# Patient Record
Sex: Female | Born: 1985
Health system: Southern US, Community
[De-identification: ages and names within clinical notes are randomized; demographics above are authoritative.]

## PROBLEM LIST (undated history)

## (undated) DIAGNOSIS — Z8619 Personal history of other infectious and parasitic diseases: Secondary | ICD-10-CM

## (undated) DIAGNOSIS — Z820 Family history of epilepsy and other diseases of the nervous system: Secondary | ICD-10-CM

## (undated) DIAGNOSIS — Z9189 Other specified personal risk factors, not elsewhere classified: Secondary | ICD-10-CM

## (undated) HISTORY — PX: MOHS SURGERY: SUR867

## (undated) HISTORY — DX: Personal history of other infectious and parasitic diseases: Z86.19

## (undated) HISTORY — PX: WISDOM TOOTH EXTRACTION: SHX21

## (undated) HISTORY — DX: Family history of epilepsy and other diseases of the nervous system: Z82.0

## (undated) HISTORY — DX: Other specified personal risk factors, not elsewhere classified: Z91.89

## (undated) HISTORY — PX: LEEP: SHX91

---

## 2005-03-20 ENCOUNTER — Ambulatory Visit (HOSPITAL_COMMUNITY): Admission: RE | Admit: 2005-03-20 | Discharge: 2005-03-20 | Payer: Self-pay | Admitting: Pediatrics

## 2005-11-11 IMAGING — US US PELVIS COMPLETE
1 series · 14 of 25 positions shown · non-contrast
Comparison: none

CLINICAL DATA: The patient has abdominal pain.
 ULTRASOUND OF THE PELVIS:
 Transabdominal study reveals the uterus to measure 7.0 x 3.0 x 3.4 cm.  The examination is somewhat modified by the fact that the patient could not drink water and the urinary bladder is not optimally dilated.  The uterus and endometrium are unremarkable however with the endometrial stripe measuring 3 mm.  No free fluid is visualized.  The right and left ovary are not visualized.

[Series 1: unknown · 0.21mm/px · 14 of 38 slices shown]
[im 1/38]
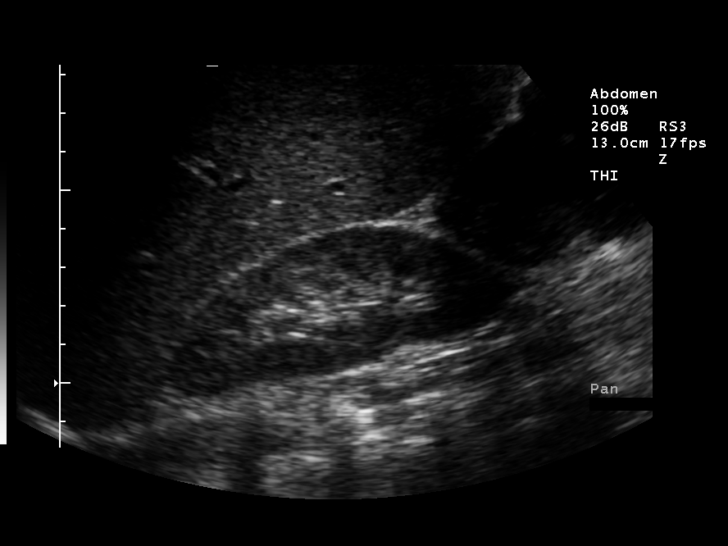
[im 4/38]
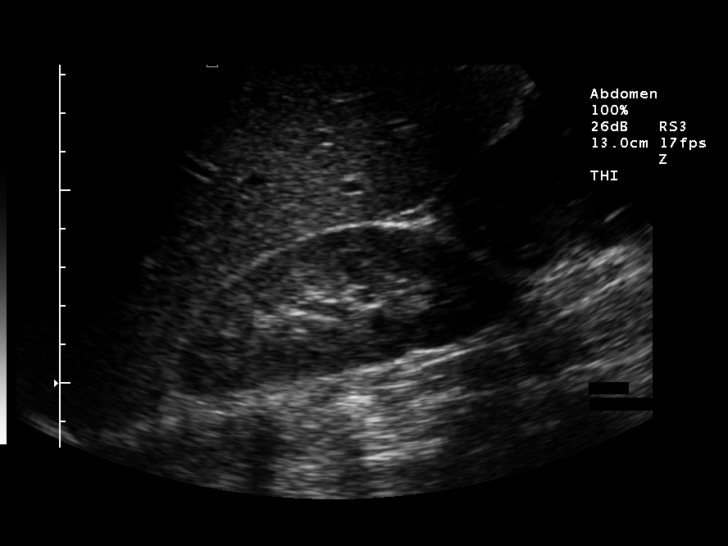
[im 7/38]
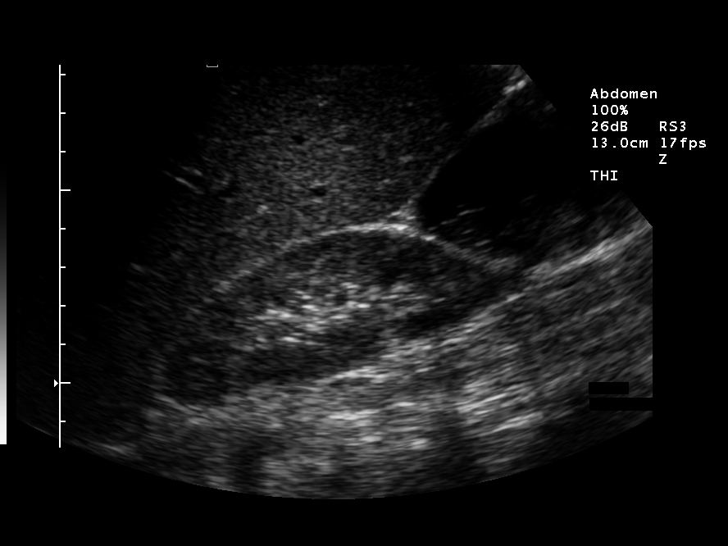
[im 10/38]
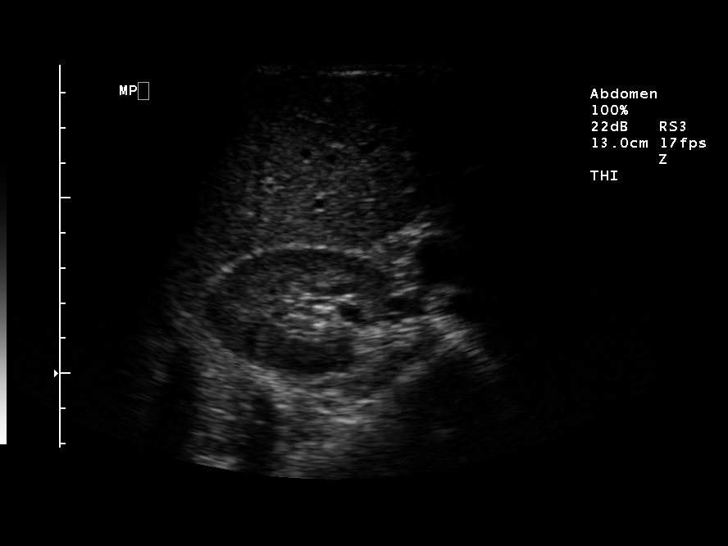
[im 13/38]
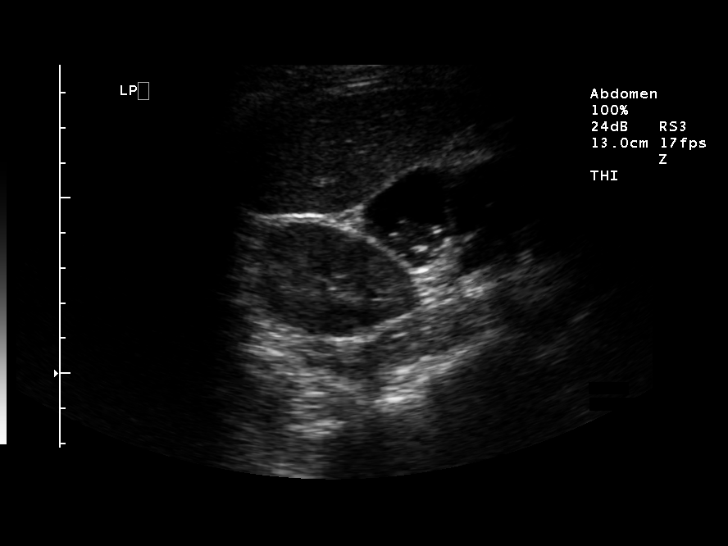
[im 14/38]
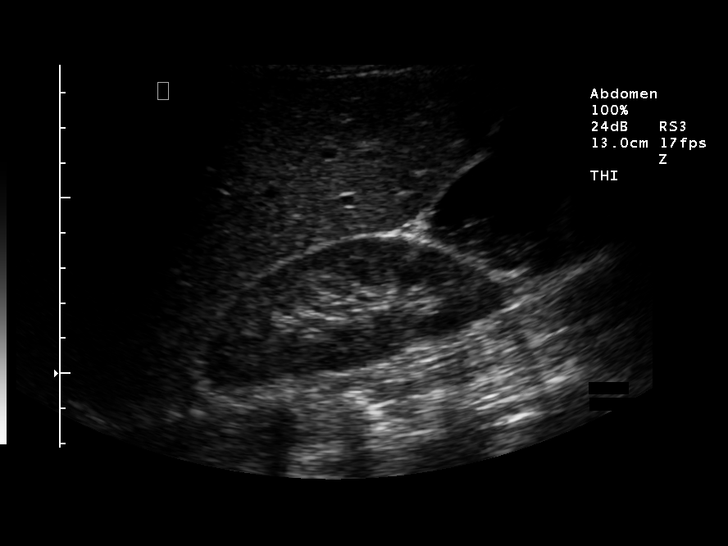
[im 17/38]
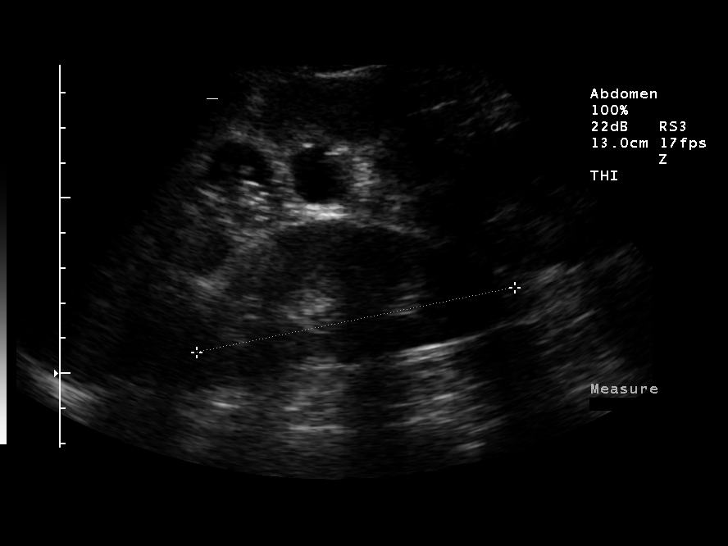
[im 21/38]
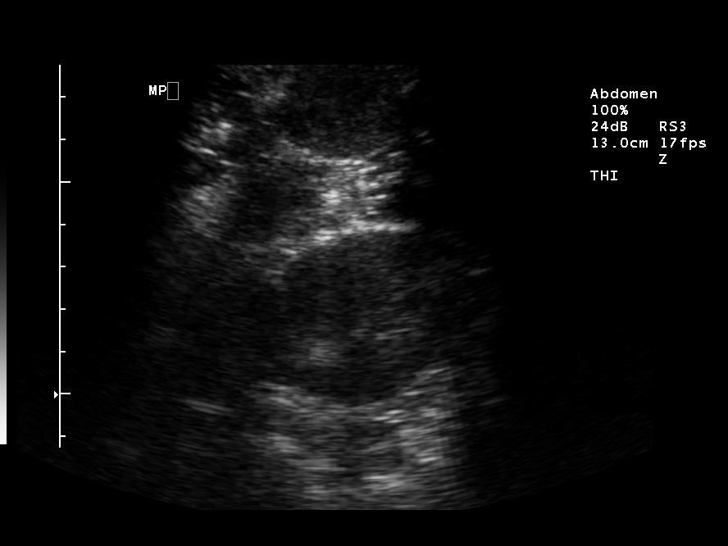
[im 24/38]
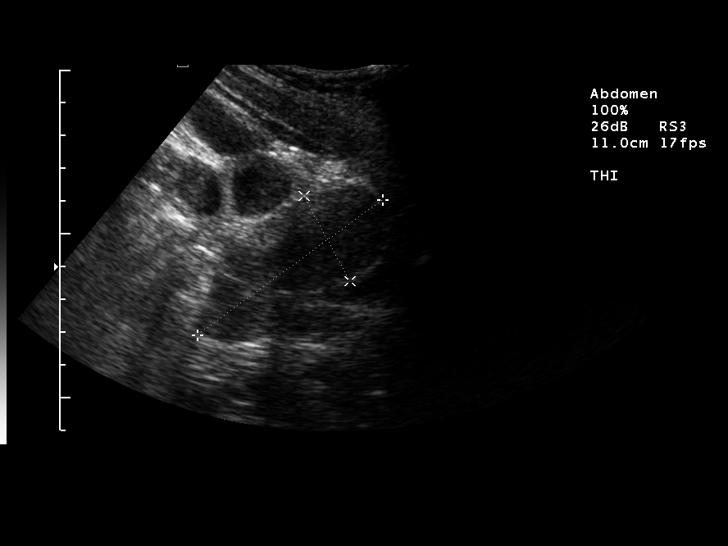
[im 25/38]
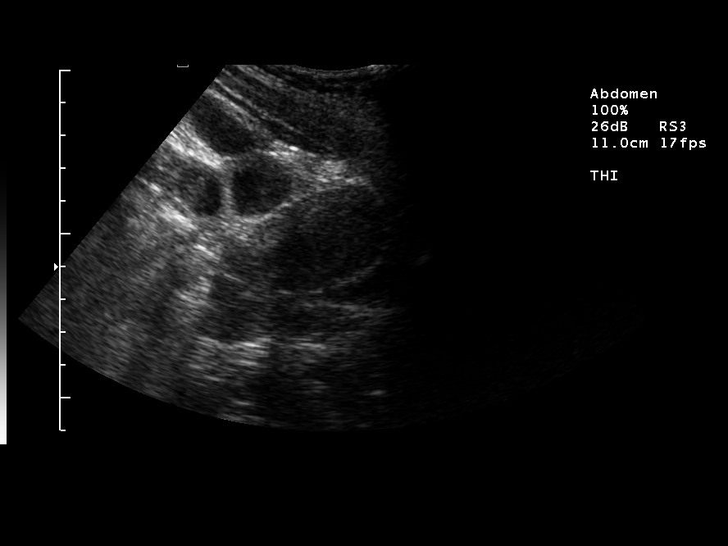
[im 28/38]
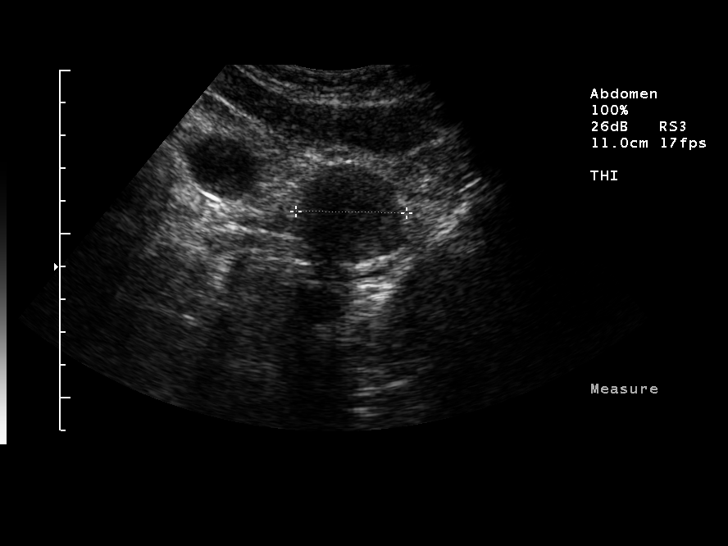
[im 31/38]
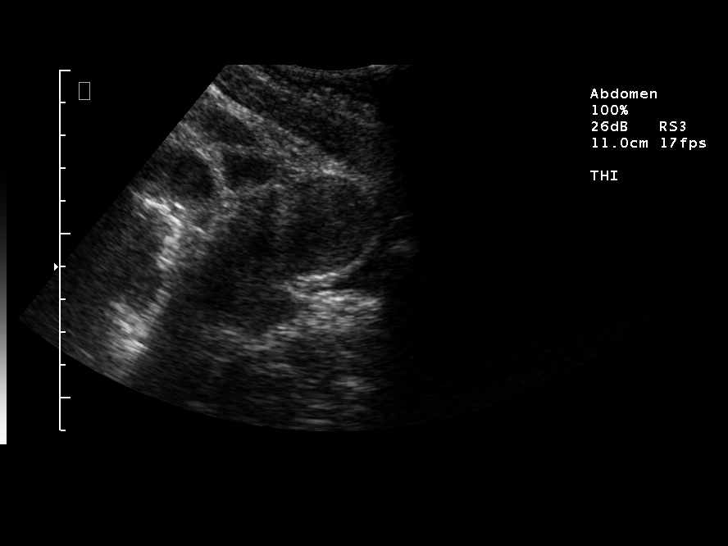
[im 34/38]
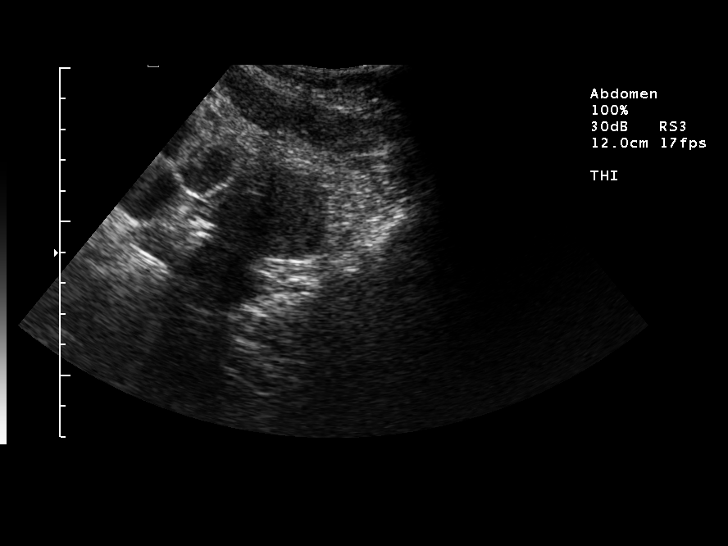
[im 38/38]
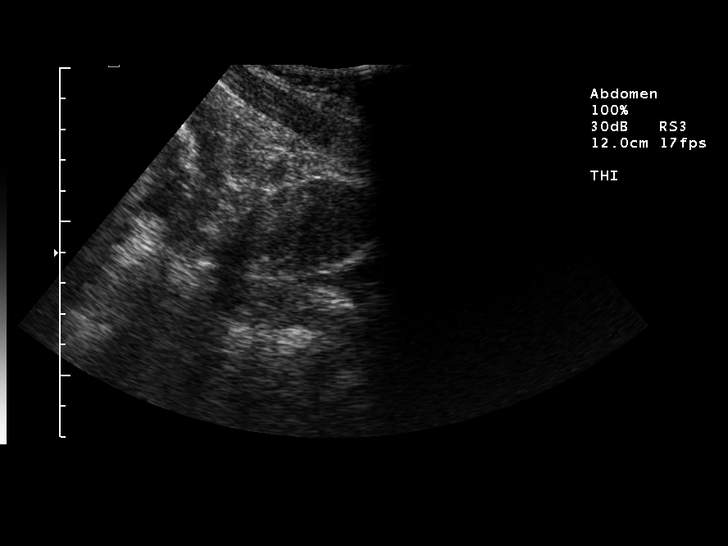

[14 of 25 positions shown; findings below may reference images not displayed]

IMPRESSION: No abnormality.  No evidence of free fluid in the pelvis.  There are noted to be multiple fluid-filled loops of bowel compatible with gastroenteritis.
 RENAL ULTRASOUND:
 The right kidney measures 9.3 cm and the left 9.3 cm.  No hydronephrosis.  The urinary bladder is poorly distended.
IMPRESSION: No abnormality.  No evidence of dilatation of the collecting systems.

## 2005-11-11 IMAGING — CR DG ABDOMEN 1V
1 series · 1 of 1 positions shown · non-contrast
Comparison: none

CLINICAL DATA: Abdominal pain. 
 ABDOMEN ? 1 VIEW:
 AP view of the abdomen reveals the bowel gas pattern to be unremarkable.  There is question of free fluid in the pelvis.

[view not recorded]
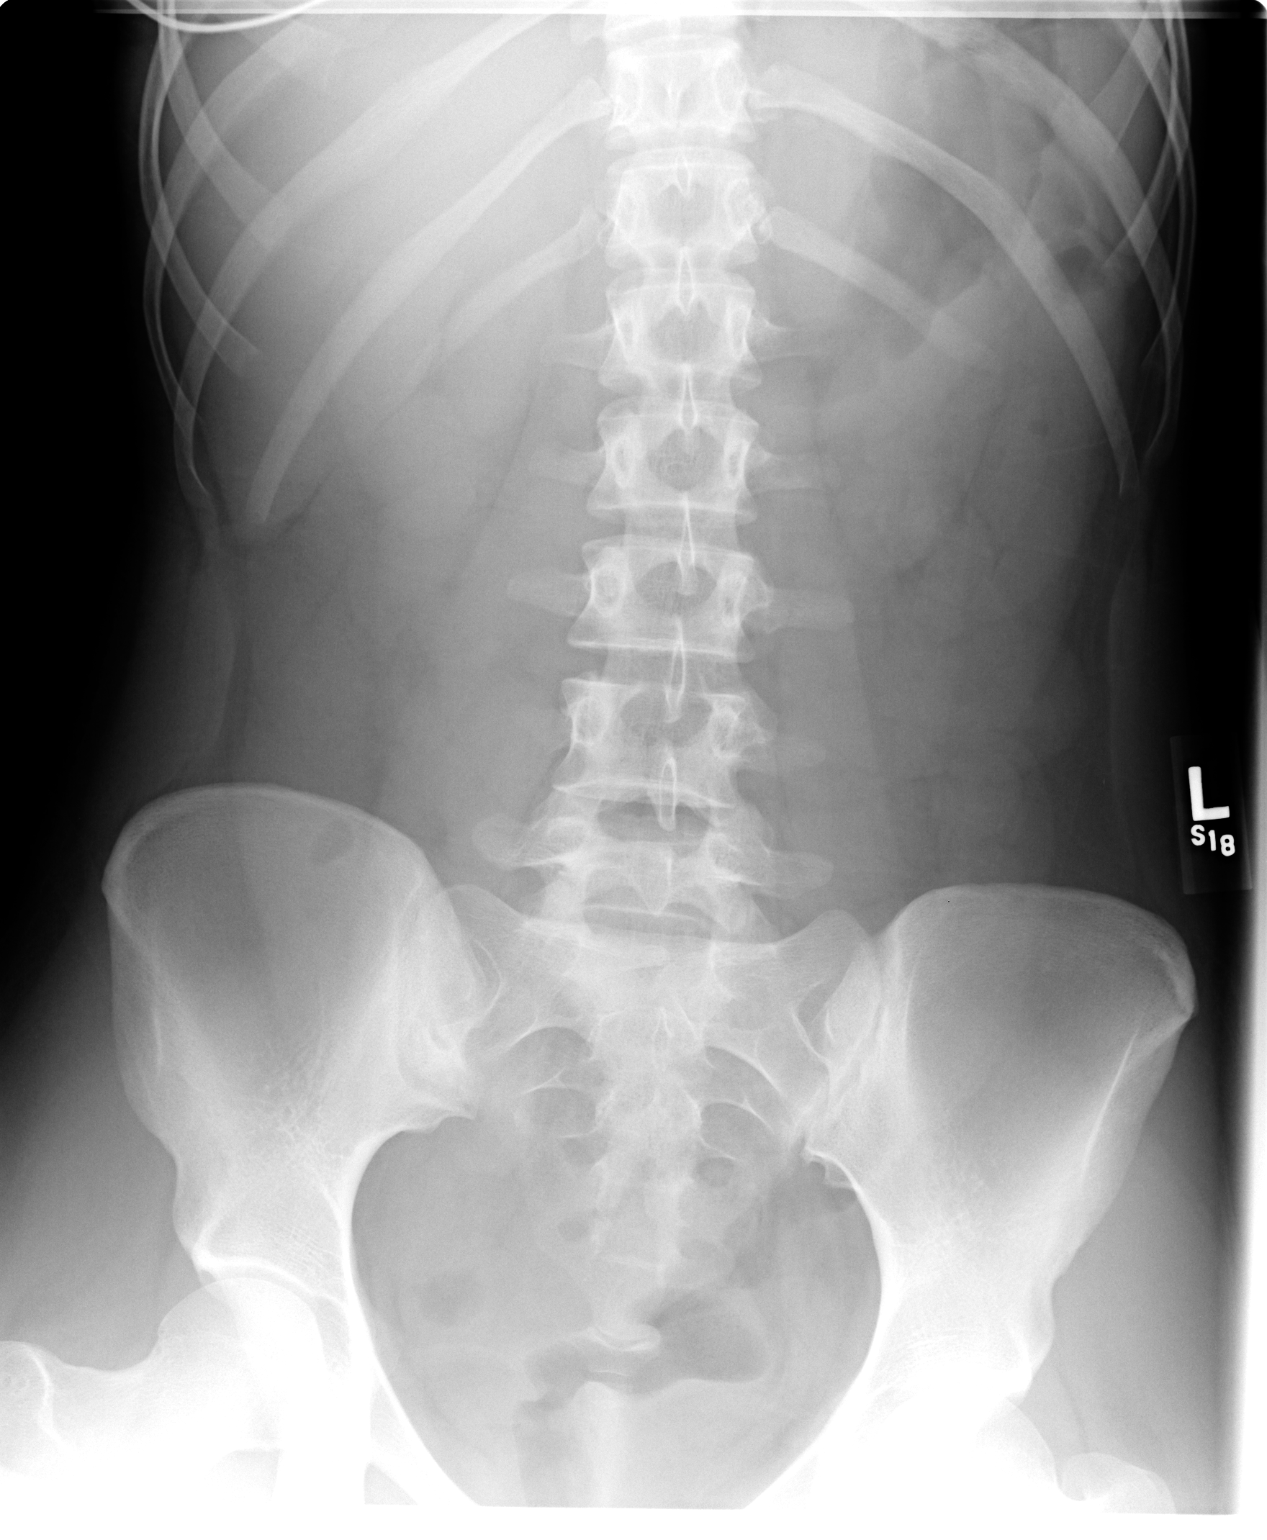

[1 of 1 positions shown; findings below may reference images not displayed]

IMPRESSION: The patient is in extreme abdominal pain.  There is questionable fluid in the pelvis and ultrasound is suggested for further evaluation.

## 2011-09-15 ENCOUNTER — Other Ambulatory Visit (HOSPITAL_COMMUNITY): Payer: Self-pay | Admitting: Orthopaedic Surgery

## 2011-09-15 DIAGNOSIS — M79606 Pain in leg, unspecified: Secondary | ICD-10-CM

## 2011-09-15 DIAGNOSIS — R52 Pain, unspecified: Secondary | ICD-10-CM

## 2011-09-19 ENCOUNTER — Encounter (HOSPITAL_COMMUNITY)
Admission: RE | Admit: 2011-09-19 | Discharge: 2011-09-19 | Disposition: A | Discharge status: Home / Self Care | Payer: 59 | Source: Ambulatory Visit | Attending: Orthopaedic Surgery | Admitting: Orthopaedic Surgery

## 2011-09-19 DIAGNOSIS — R948 Abnormal results of function studies of other organs and systems: Secondary | ICD-10-CM | POA: Insufficient documentation

## 2011-09-19 DIAGNOSIS — M79609 Pain in unspecified limb: Secondary | ICD-10-CM | POA: Insufficient documentation

## 2011-09-19 DIAGNOSIS — R52 Pain, unspecified: Secondary | ICD-10-CM | POA: Insufficient documentation

## 2011-09-19 DIAGNOSIS — M79606 Pain in leg, unspecified: Secondary | ICD-10-CM

## 2011-09-19 MED ORDER — TECHNETIUM TC 99M MEDRONATE IV KIT
25.0000 | PACK | Freq: Once | INTRAVENOUS | Status: AC | PRN
  Administered 2011-09-19: 25 via INTRAVENOUS

## 2012-05-12 IMAGING — NM NM BONE 3 PHASE
1 series · 12 of 12 positions shown · non-contrast
Comparison: None

CLINICAL DATA: Right lower leg pain.  Pain after long distance from
the

NUCLEAR MEDICINE THREE PHASE BONE SCAN
TECHNIQUE: Radionuclide angiographic images, immediate static
blood pool images, and 3-hour delayed static images were obtained
after intravenous injection of radiopharmaceutical.
Radiopharmaceutical:25 mCi technetium 99 MDP

[Series 0: 3 phase bone · 9.33mm/px · 2 acquisitions, 12 frames shown]
[im 1/2]
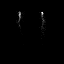
[im 1/2]
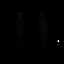
[im 1/2]
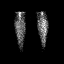
[im 1/2]
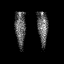
[im 1/2]
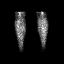
[im 1/2]
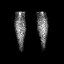
[im 2/2]
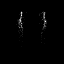
[im 2/2]
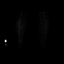
[im 2/2]
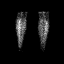
[im 2/2]
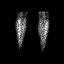
[im 2/2]
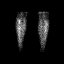
[im 2/2]
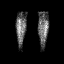

[12 of 12 positions shown; findings below may reference images not displayed]

FINDINGS: Vascular flow:  No abnormal or  asymmetric blood flow to the lower
extremities.

Blood pool: There is a focus of mild abnormal activity within the
distal third of the right tibial diaphysis.

Delayed phase:  There is intense activity in the distal right tibia
which correlates with abnormal blood pool activity.  This is along
the posterior medial aspect of the right tibial metadiaphysis.
IMPRESSION: Findings most consistent with stress fracture of the distal tibial
diaphysis.

## 2012-08-11 ENCOUNTER — Ambulatory Visit (INDEPENDENT_AMBULATORY_CARE_PROVIDER_SITE_OTHER): Payer: 59 | Admitting: Physician Assistant

## 2012-08-11 VITALS — BP 116/70 | HR 80 | Temp 97.7°F | Resp 16 | Ht 64.0 in | Wt 121.0 lb

## 2012-08-11 DIAGNOSIS — J019 Acute sinusitis, unspecified: Secondary | ICD-10-CM

## 2012-08-11 DIAGNOSIS — J329 Chronic sinusitis, unspecified: Secondary | ICD-10-CM

## 2012-08-11 DIAGNOSIS — R059 Cough, unspecified: Secondary | ICD-10-CM

## 2012-08-11 DIAGNOSIS — R05 Cough: Secondary | ICD-10-CM

## 2012-08-11 MED ORDER — HYDROCOD POLST-CHLORPHEN POLST 10-8 MG/5ML PO LQCR: 5.0000 mL | Freq: Two times a day (BID) | ORAL | Status: DC | PRN

## 2012-08-11 MED ORDER — GUAIFENESIN ER 1200 MG PO TB12: 1.0000 | ORAL_TABLET | Freq: Two times a day (BID) | ORAL | Status: DC | PRN

## 2012-08-11 MED ORDER — CEFDINIR 300 MG PO CAPS: 600.0000 mg | ORAL_CAPSULE | Freq: Every day | ORAL | Status: DC

## 2012-08-11 MED ORDER — IPRATROPIUM BROMIDE 0.03 % NA SOLN: 2.0000 | Freq: Two times a day (BID) | NASAL | Status: DC

## 2012-08-11 NOTE — Progress Notes (Signed)
Subjective:    Patient ID: Lindsay Case, female    DOB: 03/23/1986, 26 y.o.   MRN: [id number]  HPI  This 26 y.o. female presents for evaluation of illness x 2 weeks.  She first developed a sore throat 2 weeks ago while flying home from a trip to [place].  After a few days, the sore throat resolved, but she developed nasal congestion, drainage, watery eyes and then cough.  She thought she was improving 08/07/2012, but has worsened again, worse than initially.  Her cough is now productive of green sputum.  She describes facial pressure and pain.  No fever, chills, GI/GU symptoms, myalgias, arthralgias or rash.   Review of Systems As above.  History reviewed. No pertinent past medical history.  Past Surgical History  Procedure Date  . Wisdom tooth extraction     Prior to Admission medications   Medication Sig Start Date End Date Taking? Authorizing Provider  norethindrone-ethinyl estradiol-iron (MICROGESTIN FE,GILDESS FE,LOESTRIN FE) 1.5-30 MG-MCG tablet Take 1 tablet by mouth daily.   Yes Historical Provider, MD    No Known Allergies  History   Social History  . Marital Status: Single    Spouse Name: n/a    Number of Children: 0  . Years of Education: N/A   Occupational History  . RN     General Surgery; [employer]   Social History Main Topics  . Smoking status: Never Smoker   . Smokeless tobacco: Never Used  . Alcohol Use: 1.5 oz/week    3 drink(s) per week  . Drug Use: No  . Sexually Active: Yes    Birth Control/ Protection: Pill   Other Topics Concern  . Not on file   Social History Narrative   Lives with parents.  Long distance relationship with Michele Mcalpine ([facility], [employer])    History reviewed. No pertinent family history.     Objective:   Physical Exam  Vitals reviewed. Constitutional: She is oriented to person, place, and time. Vital signs are normal. She appears well-developed and well-nourished. No distress.  HENT:  Head: Normocephalic and  atraumatic.  Right Ear: Hearing, tympanic membrane, external ear and ear canal normal.  Left Ear: Hearing, tympanic membrane, external ear and ear canal normal.  Nose: Mucosal edema and rhinorrhea present.  No foreign bodies. Right sinus exhibits maxillary sinus tenderness and frontal sinus tenderness. Left sinus exhibits maxillary sinus tenderness and frontal sinus tenderness.  Mouth/Throat: Uvula is midline, oropharynx is clear and moist and mucous membranes are normal. No uvula swelling. No oropharyngeal exudate.  Eyes: Conjunctivae normal and EOM are normal. Pupils are equal, round, and reactive to light. Right eye exhibits no discharge. Left eye exhibits no discharge. No scleral icterus.  Neck: Trachea normal, normal range of motion and full passive range of motion without pain. Neck supple. No mass and no thyromegaly present.  Cardiovascular: Normal rate, regular rhythm and normal heart sounds.   Pulmonary/Chest: Effort normal and breath sounds normal.  Lymphadenopathy:       Head (right side): No submandibular, no tonsillar, no preauricular, no posterior auricular and no occipital adenopathy present.       Head (left side): No submandibular, no tonsillar, no preauricular and no occipital adenopathy present.    She has no cervical adenopathy.       Right: No supraclavicular adenopathy present.       Left: No supraclavicular adenopathy present.  Neurological: She is alert and oriented to person, place, and time. She has normal strength. No cranial  nerve deficit or sensory deficit.  Skin: Skin is warm, dry and intact. No rash noted.  Psychiatric: She has a normal mood and affect. Her speech is normal and behavior is normal.      Assessment & Plan:   1. Sinusitis  ipratropium (ATROVENT) 0.03 % nasal spray, Guaifenesin (MUCINEX MAXIMUM STRENGTH) 1200 MG TB12, cefdinir (OMNICEF) 300 MG capsule  2. Cough  chlorpheniramine-HYDROcodone (TUSSIONEX PENNKINETIC ER) 10-8 MG/5ML [facility]   Supportive  care.  RTC if symptoms worsen/persist.

## 2012-08-11 NOTE — Patient Instructions (Signed)
Get plenty of rest and drink at least 64 ounces of water daily. 

## 2012-11-03 DIAGNOSIS — N871 Moderate cervical dysplasia: Secondary | ICD-10-CM | POA: Insufficient documentation

## 2013-11-02 ENCOUNTER — Ambulatory Visit (INDEPENDENT_AMBULATORY_CARE_PROVIDER_SITE_OTHER): Payer: 59 | Admitting: Family Medicine

## 2013-11-02 VITALS — BP 102/60 | HR 105 | Temp 98.8°F | Resp 18 | Ht 64.0 in | Wt 120.0 lb

## 2013-11-02 DIAGNOSIS — R059 Cough, unspecified: Secondary | ICD-10-CM

## 2013-11-02 DIAGNOSIS — J111 Influenza due to unidentified influenza virus with other respiratory manifestations: Secondary | ICD-10-CM

## 2013-11-02 DIAGNOSIS — J101 Influenza due to other identified influenza virus with other respiratory manifestations: Secondary | ICD-10-CM

## 2013-11-02 DIAGNOSIS — R52 Pain, unspecified: Secondary | ICD-10-CM

## 2013-11-02 DIAGNOSIS — R05 Cough: Secondary | ICD-10-CM

## 2013-11-02 LAB — POCT INFLUENZA A/B
Influenza A, POC: POSITIVE
Influenza B, POC: NEGATIVE

## 2013-11-02 MED ORDER — HYDROCODONE-HOMATROPINE 5-1.5 MG/5ML PO SYRP: 5.0000 mL | ORAL_SOLUTION | ORAL | Status: DC | PRN

## 2013-11-02 MED ORDER — OSELTAMIVIR PHOSPHATE 75 MG PO CAPS: 75.0000 mg | ORAL_CAPSULE | Freq: Two times a day (BID) | ORAL | Status: DC

## 2013-11-02 NOTE — Progress Notes (Signed)
Subjective: Patient has had a sore throat starting at Christmas and some upper respiratory infection symptoms. She got abruptly worse today. She has body aches, chills. She took some Tylenol. She was in surgery and got sore achy she just had to leave. She is a scrub nurse. Her head is congested and she is coughing.  Objective: TMs normal. Nose congested. She is ill-appearing. Her chest is clear. Heart regular without murmurs. Throat clear. Neck supple without nodes.  Assessment: Flulike illness  Plan: Flu test  Results for orders placed in visit on 11/02/13  POCT INFLUENZA A/B      Result Value Range   Influenza A, POC Positive     Influenza B, POC Negative     Have a flu for patient. Also gave a prophylactic dose for her boyfriend, Tonnia Bardin. Hycodan cough Tylenol and ibuprofen Returns if worse

## 2013-11-02 NOTE — Patient Instructions (Addendum)
Drink plenty of fluids  Get a lot of rest  Take the hydrocodone cough syrup 1 teaspoon every 4-6 hours as needed for cough  Take the Tamiflu one twice daily  Use Claritin-D or Allegra-D or Zyrtec daily if needed for congestion  Tylenol or ibuprofen for fever and aching  Return if acutely worse  Influenza, Adult Influenza ("the flu") is a viral infection of the respiratory tract. It occurs more often in winter months because people spend more time in close contact with one another. Influenza can make you feel very sick. Influenza easily spreads from person to person (contagious). CAUSES  Influenza is caused by a virus that infects the respiratory tract. You can catch the virus by breathing in droplets from an infected person's cough or sneeze. You can also catch the virus by touching something that was recently contaminated with the virus and then touching your mouth, nose, or eyes. SYMPTOMS  Symptoms typically last 4 to 10 days and may include:  Fever.  Chills.  Headache, body aches, and muscle aches.  Sore throat.  Chest discomfort and cough.  Poor appetite.  Weakness or feeling tired.  Dizziness.  Nausea or vomiting. DIAGNOSIS  Diagnosis of influenza is often made based on your history and a physical exam. A nose or throat swab test can be done to confirm the diagnosis. RISKS AND COMPLICATIONS You may be at risk for a more severe case of influenza if you smoke cigarettes, have diabetes, have chronic heart disease (such as heart failure) or lung disease (such as asthma), or if you have a weakened immune system. Elderly people and pregnant women are also at risk for more serious infections. The most common complication of influenza is a lung infection (pneumonia). Sometimes, this complication can require emergency medical care and may be life-threatening. PREVENTION  An annual influenza vaccination (flu shot) is the best way to avoid getting influenza. An annual flu shot is  now routinely recommended for all adults in the [place] TREATMENT  In mild cases, influenza goes away on its own. Treatment is directed at relieving symptoms. For more severe cases, your caregiver may prescribe antiviral medicines to shorten the sickness. Antibiotic medicines are not effective, because the infection is caused by a virus, not by bacteria. HOME CARE INSTRUCTIONS  Only take over-the-counter or prescription medicines for pain, discomfort, or fever as directed by your caregiver.  Use a cool mist humidifier to make breathing easier.  Get plenty of rest until your temperature returns to normal. This usually takes 3 to 4 days.  Drink enough fluids to keep your urine clear or pale yellow.  Cover your mouth and nose when coughing or sneezing, and wash your hands well to avoid spreading the virus.  Stay home from work or school until your fever has been gone for at least 1 full day. SEEK MEDICAL CARE IF:   You have chest pain or a deep cough that worsens or produces more mucus.  You have nausea, vomiting, or diarrhea. SEEK IMMEDIATE MEDICAL CARE IF:   You have difficulty breathing, shortness of breath, or your skin or nails turn bluish.  You have severe neck pain or stiffness.  You have a severe headache, facial pain, or earache.  You have a worsening or recurring fever.  You have nausea or vomiting that cannot be controlled. MAKE SURE YOU:  Understand these instructions.  Will watch your condition.  Will get help right away if you are not doing well or get worse. Document Released:  10/17/2000 Document Revised: 04/20/2012 Document Reviewed: 01/19/2012 ExitCare Patient Information 2014 [place], [place].

## 2014-05-22 ENCOUNTER — Ambulatory Visit: Payer: 59 | Attending: Orthopaedic Surgery | Admitting: Physical Therapy

## 2014-05-22 DIAGNOSIS — M25579 Pain in unspecified ankle and joints of unspecified foot: Secondary | ICD-10-CM | POA: Insufficient documentation

## 2014-05-22 DIAGNOSIS — IMO0001 Reserved for inherently not codable concepts without codable children: Secondary | ICD-10-CM | POA: Diagnosis not present

## 2014-05-22 DIAGNOSIS — R5381 Other malaise: Secondary | ICD-10-CM | POA: Diagnosis not present

## 2014-05-22 DIAGNOSIS — R609 Edema, unspecified: Secondary | ICD-10-CM | POA: Insufficient documentation

## 2014-06-06 ENCOUNTER — Ambulatory Visit: Payer: 59 | Attending: Orthopaedic Surgery | Admitting: Rehabilitation

## 2014-06-06 DIAGNOSIS — R5381 Other malaise: Secondary | ICD-10-CM | POA: Insufficient documentation

## 2014-06-06 DIAGNOSIS — R609 Edema, unspecified: Secondary | ICD-10-CM | POA: Diagnosis not present

## 2014-06-06 DIAGNOSIS — M25579 Pain in unspecified ankle and joints of unspecified foot: Secondary | ICD-10-CM | POA: Diagnosis not present

## 2014-06-06 DIAGNOSIS — IMO0001 Reserved for inherently not codable concepts without codable children: Secondary | ICD-10-CM | POA: Insufficient documentation

## 2014-06-12 ENCOUNTER — Ambulatory Visit: Payer: 59 | Admitting: Physical Therapy

## 2014-06-12 DIAGNOSIS — IMO0001 Reserved for inherently not codable concepts without codable children: Secondary | ICD-10-CM | POA: Diagnosis not present

## 2014-06-15 ENCOUNTER — Ambulatory Visit: Payer: 59 | Admitting: Rehabilitation

## 2014-06-15 DIAGNOSIS — IMO0001 Reserved for inherently not codable concepts without codable children: Secondary | ICD-10-CM | POA: Diagnosis not present

## 2014-07-05 ENCOUNTER — Other Ambulatory Visit (HOSPITAL_COMMUNITY): Payer: Self-pay | Admitting: Orthopaedic Surgery

## 2014-07-06 ENCOUNTER — Other Ambulatory Visit (HOSPITAL_COMMUNITY): Payer: Self-pay | Admitting: Orthopaedic Surgery

## 2014-07-06 DIAGNOSIS — M25572 Pain in left ankle and joints of left foot: Secondary | ICD-10-CM

## 2014-07-16 ENCOUNTER — Ambulatory Visit (HOSPITAL_COMMUNITY)
Admission: RE | Admit: 2014-07-16 | Discharge: 2014-07-16 | Disposition: A | Discharge status: Home / Self Care | Payer: 59 | Source: Ambulatory Visit | Attending: Orthopaedic Surgery | Admitting: Orthopaedic Surgery

## 2014-07-16 DIAGNOSIS — S8360XA Sprain of the superior tibiofibular joint and ligament, unspecified knee, initial encounter: Secondary | ICD-10-CM | POA: Diagnosis not present

## 2014-07-16 DIAGNOSIS — M25579 Pain in unspecified ankle and joints of unspecified foot: Secondary | ICD-10-CM | POA: Diagnosis present

## 2014-07-16 DIAGNOSIS — IMO0002 Reserved for concepts with insufficient information to code with codable children: Secondary | ICD-10-CM | POA: Insufficient documentation

## 2014-07-16 DIAGNOSIS — Y9366 Activity, soccer: Secondary | ICD-10-CM | POA: Diagnosis not present

## 2014-07-16 DIAGNOSIS — S99919A Unspecified injury of unspecified ankle, initial encounter: Secondary | ICD-10-CM | POA: Diagnosis present

## 2014-07-16 DIAGNOSIS — M25572 Pain in left ankle and joints of left foot: Secondary | ICD-10-CM

## 2014-07-16 DIAGNOSIS — R609 Edema, unspecified: Secondary | ICD-10-CM | POA: Insufficient documentation

## 2014-07-16 DIAGNOSIS — S8990XA Unspecified injury of unspecified lower leg, initial encounter: Secondary | ICD-10-CM | POA: Diagnosis present

## 2015-11-06 MED FILL — LARIN FE 1.5-30 TABLET: 1.5-30 | 84 days supply | Qty: 84 | Fill #2

## 2016-01-31 MED FILL — LARIN FE 1.5-30 TABLET: 1.5-30 | 84 days supply | Qty: 84 | Fill #3

## 2016-03-04 MED FILL — ONDANSETRON HCL 4 MG TABLET: 4 | 3 days supply | Qty: 6 | Fill #0

## 2016-04-23 MED FILL — LARIN FE 1.5-30 TABLET: 1.5-30 | 84 days supply | Qty: 84 | Fill #4

## 2016-07-03 DIAGNOSIS — N879 Dysplasia of cervix uteri, unspecified: Secondary | ICD-10-CM | POA: Diagnosis not present

## 2016-07-03 DIAGNOSIS — Z124 Encounter for screening for malignant neoplasm of cervix: Secondary | ICD-10-CM | POA: Diagnosis not present

## 2016-07-03 DIAGNOSIS — Z0000 Encounter for general adult medical examination without abnormal findings: Secondary | ICD-10-CM | POA: Diagnosis not present

## 2016-07-03 DIAGNOSIS — Z01419 Encounter for gynecological examination (general) (routine) without abnormal findings: Secondary | ICD-10-CM | POA: Diagnosis not present

## 2016-07-03 DIAGNOSIS — Z6823 Body mass index (BMI) 23.0-23.9, adult: Secondary | ICD-10-CM | POA: Diagnosis not present

## 2016-07-21 MED FILL — LARIN FE 1.5-30 TABLET: 1.5-30 | 84 days supply | Qty: 84 | Fill #0

## 2016-08-26 DIAGNOSIS — D485 Neoplasm of uncertain behavior of skin: Secondary | ICD-10-CM | POA: Diagnosis not present

## 2016-08-26 DIAGNOSIS — D225 Melanocytic nevi of trunk: Secondary | ICD-10-CM | POA: Diagnosis not present

## 2016-09-22 DIAGNOSIS — D485 Neoplasm of uncertain behavior of skin: Secondary | ICD-10-CM | POA: Diagnosis not present

## 2016-09-22 DIAGNOSIS — L988 Other specified disorders of the skin and subcutaneous tissue: Secondary | ICD-10-CM | POA: Diagnosis not present

## 2016-09-22 MED FILL — MUPIROCIN 2% OINTMENT: 2 | 15 days supply | Qty: 22 | Fill #0

## 2016-10-09 MED FILL — LARIN FE 1.5-30 TABLET: 1.5-30 | 84 days supply | Qty: 84 | Fill #1

## 2017-01-05 MED FILL — LARIN FE 1.5-30 TABLET: 1.5-30 | 84 days supply | Qty: 84 | Fill #2

## 2017-03-24 MED FILL — LARIN FE 1.5-30 TABLET: 1.5-30 | 84 days supply | Qty: 84 | Fill #3

## 2017-06-19 MED FILL — LARIN FE 1.5-30 TABLET: 1.5-30 | 84 days supply | Qty: 84 | Fill #4

## 2017-07-09 DIAGNOSIS — Z124 Encounter for screening for malignant neoplasm of cervix: Secondary | ICD-10-CM | POA: Diagnosis not present

## 2017-07-09 DIAGNOSIS — Z01419 Encounter for gynecological examination (general) (routine) without abnormal findings: Secondary | ICD-10-CM | POA: Diagnosis not present

## 2017-07-09 LAB — HM PAP SMEAR

## 2017-08-12 ENCOUNTER — Encounter: Payer: Self-pay | Admitting: Family Medicine

## 2017-08-12 ENCOUNTER — Ambulatory Visit (INDEPENDENT_AMBULATORY_CARE_PROVIDER_SITE_OTHER): Payer: 59 | Admitting: Family Medicine

## 2017-08-12 VITALS — BP 101/68 | HR 65 | Temp 98.0°F | Resp 16 | Ht 64.0 in | Wt 131.1 lb

## 2017-08-12 DIAGNOSIS — Z0000 Encounter for general adult medical examination without abnormal findings: Secondary | ICD-10-CM | POA: Insufficient documentation

## 2017-08-12 DIAGNOSIS — R55 Syncope and collapse: Secondary | ICD-10-CM | POA: Insufficient documentation

## 2017-08-12 DIAGNOSIS — M4124 Other idiopathic scoliosis, thoracic region: Secondary | ICD-10-CM | POA: Diagnosis not present

## 2017-08-12 LAB — CBC WITH DIFFERENTIAL/PLATELET
BASOS ABS: 0 10*3/uL (ref 0.0–0.1)
Basophils Relative: 0.7 % (ref 0.0–3.0)
Eosinophils Absolute: 0.1 10*3/uL (ref 0.0–0.7)
Eosinophils Relative: 1.2 % (ref 0.0–5.0)
HEMATOCRIT: 42.5 % (ref 36.0–46.0)
HEMOGLOBIN: 14.1 g/dL (ref 12.0–15.0)
LYMPHS PCT: 38.1 % (ref 12.0–46.0)
Lymphs Abs: 2.5 10*3/uL (ref 0.7–4.0)
MCHC: 33.2 g/dL (ref 30.0–36.0)
MCV: 93.4 fl (ref 78.0–100.0)
MONOS PCT: 9.4 % (ref 3.0–12.0)
Monocytes Absolute: 0.6 10*3/uL (ref 0.1–1.0)
NEUTROS ABS: 3.4 10*3/uL (ref 1.4–7.7)
Neutrophils Relative %: 50.6 % (ref 43.0–77.0)
PLATELETS: 263 10*3/uL (ref 150.0–400.0)
RBC: 4.55 Mil/uL (ref 3.87–5.11)
RDW: 12.6 % (ref 11.5–15.5)
WBC: 6.7 10*3/uL (ref 4.0–10.5)

## 2017-08-12 LAB — HEPATIC FUNCTION PANEL
ALT: 18 U/L (ref 0–35)
AST: 14 U/L (ref 0–37)
Albumin: 4.6 g/dL (ref 3.5–5.2)
Alkaline Phosphatase: 34 U/L — ABNORMAL LOW (ref 39–117)
BILIRUBIN DIRECT: 0.1 mg/dL (ref 0.0–0.3)
BILIRUBIN TOTAL: 0.5 mg/dL (ref 0.2–1.2)
TOTAL PROTEIN: 7 g/dL (ref 6.0–8.3)

## 2017-08-12 LAB — BASIC METABOLIC PANEL
BUN: 13 mg/dL (ref 6–23)
CALCIUM: 9.9 mg/dL (ref 8.4–10.5)
CO2: 29 mEq/L (ref 19–32)
Chloride: 102 mEq/L (ref 96–112)
Creatinine, Ser: 0.89 mg/dL (ref 0.40–1.20)
GFR: 78.2 mL/min (ref >60.00)
GLUCOSE: 77 mg/dL (ref 70–99)
Potassium: 3.8 mEq/L (ref 3.5–5.1)
SODIUM: 138 meq/L (ref 135–145)

## 2017-08-12 LAB — LIPID PANEL
Cholesterol: 172 mg/dL (ref 0–200)
HDL: 65.5 mg/dL (ref >39.00)
LDL CALC: 89 mg/dL (ref 0–99)
NONHDL: 106.26
Total CHOL/HDL Ratio: 3
Triglycerides: 85 mg/dL (ref 0.0–149.0)
VLDL: 17 mg/dL (ref 0.0–40.0)

## 2017-08-12 LAB — TSH: TSH: 1.23 u[IU]/mL (ref 0.35–4.50)

## 2017-08-12 LAB — VITAMIN D 25 HYDROXY (VIT D DEFICIENCY, FRACTURES): VITD: 30.5 ng/mL (ref 30.00–100.00)

## 2017-08-12 NOTE — Progress Notes (Signed)
   Subjective:    Patient ID: Lindsay Case, female    DOB: 1986-03-01, 31 y.o.   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  Mom pointed out that shoulders are 'uneven' and pt has been having back pain that is starting to limit activities.  Pt reports she is also having 'really bad' R hip pain.  UTD on pap smear.     Review of Systems Patient reports no vision/ hearing changes, adenopathy,fever, weight change,  persistant/recurrent hoarseness , swallowing issues, chest pain, palpitations, edema, persistant/recurrent cough, hemoptysis, dyspnea (rest/exertional/paroxysmal nocturnal), gastrointestinal bleeding (melena, rectal bleeding), abdominal pain, significant heartburn, bowel changes, GU symptoms (dysuria, hematuria, incontinence), Gyn symptoms (abnormal  bleeding, pain),  syncope, focal weakness, memory loss, numbness & tingling, skin/hair/nail changes, abnormal bruising or bleeding.  + depression- pt would note worsening of sxs around period.  Decreased energy level and motivation.  GYN just changed OCPs to see if that would improve things hormonal.     Objective:   Physical Exam General Appearance:    Alert, cooperative, no distress, appears stated age  Head:    Normocephalic, without obvious abnormality, atraumatic  Eyes:    PERRL, conjunctiva/corneas clear, EOM's intact, fundi    benign, both eyes  Ears:    Normal TM's and external ear canals, both ears  Nose:   Nares normal, septum midline, mucosa normal, no drainage    or sinus tenderness  Throat:   Lips, mucosa, and tongue normal; teeth and gums normal  Neck:   Supple, symmetrical, trachea midline, no adenopathy;    Thyroid: no enlargement/tenderness/nodules  Back:     Thoracic curve w/ L shoulder elevation  Lungs:     Clear to auscultation bilaterally, respirations unlabored  Chest Wall:    No tenderness or deformity   Heart:    Regular rate and  rhythm, S1 and S2 normal, no murmur, rub   or gallop  Breast Exam:    Deferred to GYN  Abdomen:     Soft, non-tender, bowel sounds active all four quadrants,    no masses, no organomegaly  Genitalia:    Deferred to GYN  Rectal:    Extremities:   Extremities normal, atraumatic, no cyanosis or edema  Pulses:   2+ and symmetric all extremities  Skin:   Skin color, texture, turgor normal, no rashes or lesions  Lymph nodes:   Cervical, supraclavicular, and axillary nodes normal  Neurologic:   CNII-XII intact, normal strength, sensation and reflexes    throughout          Assessment & Plan:

## 2017-08-12 NOTE — Assessment & Plan Note (Signed)
Pt's PE WNL w/ exception of scoliosis and shoulder asymmetry (L shoulder higher).  UTD on GYN.  Check labs.  Anticipatory guidance provided.

## 2017-08-12 NOTE — Assessment & Plan Note (Signed)
New.  Suspect this is all volume related due to low BP and low HR.  Stressed need for fluids.  Check labs to r/o electrolyte disturbance.  Will follow.

## 2017-08-12 NOTE — Patient Instructions (Signed)
Follow up in 1 year or as needed We'll notify you of your lab results and make any changes if needed Continue to work on healthy diet and regular exercise- you look great! We'll call you with your Dr Rolena Infante appt Call with any questions or concerns Welcome!  We're glad to have you!!!

## 2017-08-13 ENCOUNTER — Encounter: Payer: Self-pay | Admitting: General Practice

## 2017-08-25 DIAGNOSIS — M533 Sacrococcygeal disorders, not elsewhere classified: Secondary | ICD-10-CM | POA: Diagnosis not present

## 2017-08-25 DIAGNOSIS — M545 Low back pain: Secondary | ICD-10-CM | POA: Diagnosis not present

## 2017-08-25 DIAGNOSIS — M542 Cervicalgia: Secondary | ICD-10-CM | POA: Diagnosis not present

## 2017-08-25 DIAGNOSIS — M4150 Other secondary scoliosis, site unspecified: Secondary | ICD-10-CM | POA: Diagnosis not present

## 2017-09-09 MED FILL — LARIN FE 1.5-30 TABLET: 1.5-30 | 63 days supply | Qty: 84 | Fill #0

## 2017-09-14 DIAGNOSIS — M545 Low back pain: Secondary | ICD-10-CM | POA: Diagnosis not present

## 2017-09-14 DIAGNOSIS — G8929 Other chronic pain: Secondary | ICD-10-CM | POA: Diagnosis not present

## 2017-09-16 ENCOUNTER — Ambulatory Visit: Payer: Self-pay | Admitting: *Deleted

## 2017-09-16 NOTE — Telephone Encounter (Signed)
Just FYI -    Patient will call back if no improvement and she needs an appt.

## 2017-09-16 NOTE — Telephone Encounter (Signed)
Patient to call back if her cough persist and does not get better with OTC treatment.  Reason for Disposition . Cough  Answer Assessment - Initial Assessment Questions 1. ONSET: "When did the cough begin?"      Friday-11/9 2. SEVERITY: "How bad is the cough today?"      Seems worse during the day- worse when talking 3. RESPIRATORY DISTRESS: "Describe your breathing."      no 4. FEVER: "Do you have a fever?" If so, ask: "What is your temperature, how was it measured, and when did it start?"     no 5. HEMOPTYSIS: "Are you coughing up any blood?" If so ask: "How much?" (flecks, streaks, tablespoons, etc.)     no 6. TREATMENT: "What have you done so far to treat the cough?" (e.g., meds, fluids, humidifier)     Delsym       Tried a dose of Nyquil- no noticeably helpful Not helping 7. CARDIAC HISTORY: "Do you have any history of heart disease?" (e.g., heart attack, congestive heart failure)      no 8. LUNG HISTORY: "Do you have any history of lung disease?"  (e.g., pulmonary embolus, asthma, emphysema)     no 9. PE RISK FACTORS: "Do you have a history of blood clots?" (or: recent major surgery, recent prolonged travel, bedridden )     no 10. OTHER SYMPTOMS: "Do you have any other symptoms? (e.g., runny nose, wheezing, chest pain)       no 11. PREGNANCY: "Is there any chance you are pregnant?" "When was your last menstrual period?"       No- LMP 2 weeks ago 12. TRAVEL: "Have you traveled out of the country in the last month?" (e.g., travel history, exposures)       no  Protocols used: COUGH - ACUTE NON-PRODUCTIVE-A-AH

## 2017-09-18 DIAGNOSIS — M545 Low back pain: Secondary | ICD-10-CM | POA: Diagnosis not present

## 2017-09-18 DIAGNOSIS — G8929 Other chronic pain: Secondary | ICD-10-CM | POA: Diagnosis not present

## 2017-09-21 DIAGNOSIS — G8929 Other chronic pain: Secondary | ICD-10-CM | POA: Diagnosis not present

## 2017-09-21 DIAGNOSIS — M545 Low back pain: Secondary | ICD-10-CM | POA: Diagnosis not present

## 2017-09-28 DIAGNOSIS — G8929 Other chronic pain: Secondary | ICD-10-CM | POA: Diagnosis not present

## 2017-09-28 DIAGNOSIS — M545 Low back pain: Secondary | ICD-10-CM | POA: Diagnosis not present

## 2017-11-17 DIAGNOSIS — H52222 Regular astigmatism, left eye: Secondary | ICD-10-CM | POA: Diagnosis not present

## 2017-12-02 MED FILL — LARIN FE 1.5-30 TABLET: 1.5-30 | 63 days supply | Qty: 84 | Fill #1

## 2018-01-21 ENCOUNTER — Ambulatory Visit: Payer: Self-pay | Admitting: Nurse Practitioner

## 2018-01-21 ENCOUNTER — Encounter: Payer: Self-pay | Admitting: Nurse Practitioner

## 2018-01-21 ENCOUNTER — Telehealth: Payer: 59 | Admitting: Physician Assistant

## 2018-01-21 VITALS — BP 100/80 | HR 92 | Temp 98.5°F | Wt 132.2 lb

## 2018-01-21 DIAGNOSIS — R071 Chest pain on breathing: Secondary | ICD-10-CM

## 2018-01-21 DIAGNOSIS — J40 Bronchitis, not specified as acute or chronic: Secondary | ICD-10-CM

## 2018-01-21 MED ORDER — PREDNISONE 10 MG (21) PO TBPK
ORAL_TABLET | ORAL | 0 refills | Status: AC
Start: 2018-01-21 — End: 2018-01-27

## 2018-01-21 MED ORDER — PSEUDOEPH-BROMPHEN-DM 30-2-10 MG/5ML PO SYRP: 5.0000 mL | ORAL_SOLUTION | Freq: Four times a day (QID) | ORAL | 0 refills | Status: AC | PRN

## 2018-01-21 MED ORDER — ALBUTEROL SULFATE HFA 108 (90 BASE) MCG/ACT IN AERS: 2.0000 | INHALATION_SPRAY | Freq: Four times a day (QID) | RESPIRATORY_TRACT | 0 refills | Status: DC | PRN

## 2018-01-21 MED ORDER — AZITHROMYCIN 250 MG PO TABS: ORAL_TABLET | ORAL | 0 refills | Status: AC

## 2018-01-21 NOTE — Progress Notes (Signed)
Subjective:    Patient ID: Lindsay Case, female    DOB: [birth year], 32 y.o.   MRN: [id number]  Cough  This is a new problem. The current episode started in the past 7 days. The problem has been gradually worsening. The problem occurs every few minutes. The cough is non-productive. Associated symptoms include chest pain, chills, headaches, nasal congestion, postnasal drip and rhinorrhea. Pertinent negatives include no ear pain, fever, sore throat or wheezing. Nothing aggravates the symptoms. Treatments tried: Dayquil and Nyquil. The treatment provided mild relief. Her past medical history is significant for environmental allergies. There is no history of asthma, bronchiectasis, bronchitis or pneumonia.   Patient completed and e-visit earlier today and was instructed to follow up where she can have an x-ray.  Patient informed that there was no x-ray at this location, patient states, "Im here now and this is where they told me to come".    Reviewed patient's past medical history, allergies and medications.  Review of Systems  Constitutional: Positive for chills and fatigue. Negative for fever.  HENT: Positive for postnasal drip and rhinorrhea. Negative for ear pain, sinus pressure, sinus pain, sneezing and sore throat.   Respiratory: Positive for cough. Negative for wheezing.   Cardiovascular: Positive for chest pain.  Skin: Negative.   Allergic/Immunologic: Positive for environmental allergies.  Neurological: Positive for headaches.       Objective:   Physical Exam  Constitutional: She is oriented to person, place, and time. She appears well-developed and well-nourished. She appears distressed.  Due to coughing.  Patient able to speak in complete sentences.  HENT:  Head: Normocephalic and atraumatic.  Right Ear: External ear normal.  Left Ear: External ear normal.  Eyes: Pupils are equal, round, and reactive to light. Conjunctivae and EOM are normal.  Neck: Normal range of motion.  Neck supple. No tracheal deviation present. No thyromegaly present.  Cardiovascular: Normal rate, regular rhythm and normal heart sounds.  Pulmonary/Chest: Effort normal and breath sounds normal. No respiratory distress. She has no wheezes.  Abdominal: Soft. Bowel sounds are normal. She exhibits no distension. There is no tenderness.  Neurological: She is alert and oriented to person, place, and time.  Skin: Skin is warm and dry.  Psychiatric: She has a normal mood and affect. Her behavior is normal. Judgment and thought content normal.          Assessment & Plan:  Acute Bronchitis 1.  Patient provided education for bronchitis.  Patient prescribed Azithromycin, Sterapred, Albuterol and Bromfed. Patient instructed use Ibuprofen or Tylenol for pain, fever or general discomfort.  Patient to increase fluids and use humidifier at home.  Patient can also use cough drops or honey for cough.  Patient to follow up in ER if worsening cough, difficulty breathing, SOB, etc.  Patient verbalizes understanding and had no questions at time of discharge. Meds ordered this encounter  Medications  . azithromycin (ZITHROMAX) 250 MG tablet    Sig: Take as directed.    Dispense:  6 tablet    Refill:  0    Order Specific Question:   Supervising Provider    Answer:   Ricard Dillon [[id number]]  . brompheniramine-pseudoephedrine-DM 30-2-10 MG/5ML syrup    Sig: Take 5 mLs by mouth 4 (four) times daily as needed for up to 7 days.    Dispense:  150 mL    Refill:  0    Order Specific Question:   Supervising Provider    Answer:   Benay Pillow  E [[id number]]  . albuterol (PROVENTIL HFA;VENTOLIN HFA) 108 (90 Base) MCG/ACT inhaler    Sig: Inhale 2 puffs into the lungs every 6 (six) hours as needed for up to 10 days for wheezing or shortness of breath.    Dispense:  1 Inhaler    Refill:  0    Order Specific Question:   Supervising Provider    Answer:   Ricard Dillon [[id number]]  . predniSONE (STERAPRED UNI-PAK 21 TAB) 10 MG  (21) TBPK tablet    Sig: Take as directed.    Dispense:  21 tablet    Refill:  0    Order Specific Question:   Supervising Provider    Answer:   Ricard Dillon [phone]

## 2018-01-21 NOTE — Patient Instructions (Signed)

## 2018-01-21 NOTE — Progress Notes (Signed)
Based on what you shared with me it looks like you have a condition that should be evaluated in a face to face office visit. Giving the chest and back pain you need a good lung examination and chest xray to assess for pneumonia.   NOTE: If you entered your credit card information for this eVisit, you will not be charged. You may see a "hold" on your card for the $30 but that hold will drop off and you will not have a charge processed.  If you are having a true medical emergency please call 911.  If you need an urgent face to face visit, [place] has four urgent care centers for your convenience.  If you need care fast and have a high deductible or no insurance consider:   [email or web address] to reserve your spot online an avoid wait times  [facility] [address] [place], [place] [address] 8 am to 8 pm Monday-Friday 10 am to 4 pm Saturday-Sunday *Across the street from [employer]  [place], [address] 8 am to 5 pm Monday-Friday * In the [facility] on the [facility] [place]   The following sites will take your  insurance:  . [facility] [place] a Provider at this Location  [address] [place], [place] [address] . 10 am to 8 pm Monday-Friday . 12 pm to 8 pm Saturday-Sunday   . [facility] at [place] a Provider at this Location  [address] [place], [place], [place] [address] . 8 am to 8 pm Monday-Friday . 9 am to 6 pm Saturday . 11 am to 6 pm Sunday   . [facility] at [place] Driving Directions  [address] [place], [place] [address] . 8 am to 8 pm Monday-Friday . 8 am to 4 pm Saturday-Sunday   Your e-visit answers were reviewed by a board certified advanced clinical practitioner to complete your  personal care plan.  Thank you for using e-Visits.

## 2018-01-25 ENCOUNTER — Telehealth: Payer: Self-pay

## 2018-01-25 NOTE — Telephone Encounter (Signed)
Pt did not answer her phone, I left her a voice mail stating that if she had any concerns to give [place] a call or come in to see [place].

## 2018-02-25 MED FILL — LARIN FE 1.5-30 TABLET: 1.5-30 | 63 days supply | Qty: 84 | Fill #2

## 2018-05-21 MED FILL — LARIN FE 1.5-30 TABLET: 1.5-30 | 63 days supply | Qty: 84 | Fill #3

## 2018-07-22 DIAGNOSIS — Z01419 Encounter for gynecological examination (general) (routine) without abnormal findings: Secondary | ICD-10-CM | POA: Diagnosis not present

## 2018-07-22 DIAGNOSIS — Z124 Encounter for screening for malignant neoplasm of cervix: Secondary | ICD-10-CM | POA: Diagnosis not present

## 2018-07-22 LAB — HM PAP SMEAR

## 2018-07-22 MED FILL — LARIN FE 1.5-30 TABLET: 1.5-30 | 84 days supply | Qty: 112 | Fill #0

## 2018-07-27 ENCOUNTER — Encounter: Payer: Self-pay | Admitting: General Practice

## 2018-07-27 ENCOUNTER — Ambulatory Visit: Payer: Self-pay | Admitting: Family Medicine

## 2018-07-27 VITALS — BP 112/74 | HR 83 | Temp 98.4°F | Resp 20 | Wt 134.4 lb

## 2018-07-27 DIAGNOSIS — R6889 Other general symptoms and signs: Secondary | ICD-10-CM

## 2018-07-27 DIAGNOSIS — R52 Pain, unspecified: Secondary | ICD-10-CM

## 2018-07-27 NOTE — Patient Instructions (Addendum)
Viral Illness, Adult  Influenza A/B- NEGATIVE  Viruses are tiny germs that can get into a person's body and cause illness. There are many different types of viruses, and they cause many types of illness. Viral illnesses can range from mild to severe. They can affect various parts of the body. Common illnesses that are caused by a virus include colds and the flu. A few viruses have been linked to certain cancers. What are the causes? Many types of viruses can cause illness. Viruses invade cells in your body, multiply, and cause the infected cells to malfunction or die. When the cell dies, it releases more of the virus. When this happens, you develop symptoms of the illness, and the virus continues to spread to other cells. If the virus takes over the function of the cell, it can cause the cell to divide and grow out of control, as is the case when a virus causes cancer. Different viruses get into the body in different ways. You can get a virus by:  Swallowing food or water that is contaminated with the virus.  Breathing in droplets that have been coughed or sneezed into the air by an infected person.  Touching a surface that has been contaminated with the virus and then touching your eyes, nose, or mouth.  Being bitten by an insect or animal that carries the virus.  Having sexual contact with a person who is infected with the virus.  Being exposed to blood or fluids that contain the virus, either through an open cut or during a transfusion.  If a virus enters your body, your body's defense system (immune system) will try to fight the virus. You may be at higher risk for a viral illness if your immune system is weak. What are the signs or symptoms? Symptoms vary depending on the type of virus and the location of the cells that it invades. Common symptoms of the main types of viral illnesses include: Cold and flu viruses  Fever.  Headache.  Sore throat.  Muscle aches.  Nasal  congestion.  Cough. Digestive system (gastrointestinal) viruses  Fever.  Abdominal pain.  Nausea.  Diarrhea. Liver viruses (hepatitis)  Loss of appetite.  Tiredness.  Yellowing of the skin (jaundice). Brain and spinal cord viruses  Fever.  Headache.  Stiff neck.  Nausea and vomiting.  Confusion or sleepiness. Skin viruses  Warts.  Itching.  Rash. Sexually transmitted viruses  Discharge.  Swelling.  Redness.  Rash. How is this treated? Viruses can be difficult to treat because they live within cells. Antibiotic medicines do not treat viruses because these drugs do not get inside cells. Treatment for a viral illness may include:  Resting and drinking plenty of fluids.  Medicines to relieve symptoms. These can include over-the-counter medicine for pain and fever, medicines for cough or congestion, and medicines to relieve diarrhea.  Antiviral medicines. These drugs are available only for certain types of viruses. They may help reduce flu symptoms if taken early. There are also many antiviral medicines for hepatitis and HIV/AIDS.  Some viral illnesses can be prevented with vaccinations. A common example is the flu shot. Follow these instructions at home: Medicines   Take over-the-counter and prescription medicines only as told by your health care provider.  If you were prescribed an antiviral medicine, take it as told by your health care provider. Do not stop taking the medicine even if you start to feel better.  Be aware of when antibiotics are needed and when they are  not needed. Antibiotics do not treat viruses. If your health care provider thinks that you may have a bacterial infection as well as a viral infection, you may get an antibiotic. ? Do not ask for an antibiotic prescription if you have been diagnosed with a viral illness. That will not make your illness go away faster. ? Frequently taking antibiotics when they are not needed can lead to  antibiotic resistance. When this develops, the medicine no longer works against the bacteria that it normally fights. General instructions  Drink enough fluids to keep your urine clear or pale yellow.  Rest as much as possible.  Return to your normal activities as told by your health care provider. Ask your health care provider what activities are safe for you.  Keep all follow-up visits as told by your health care provider. This is important. How is this prevented? Take these actions to reduce your risk of viral infection:  Eat a healthy diet and get enough rest.  Wash your hands often with soap and water. This is especially important when you are in public places. If soap and water are not available, use hand sanitizer.  Avoid close contact with friends and family who have a viral illness.  If you travel to areas where viral gastrointestinal infection is common, avoid drinking water or eating raw food.  Keep your immunizations up to date. Get a flu shot every year as told by your health care provider.  Do not share toothbrushes, nail clippers, razors, or needles with other people.  Always practice safe sex.  Contact a health care provider if:  You have symptoms of a viral illness that do not go away.  Your symptoms come back after going away.  Your symptoms get worse. Get help right away if:  You have trouble breathing.  You have a severe headache or a stiff neck.  You have severe vomiting or abdominal pain. This information is not intended to replace advice given to you by your health care provider. Make sure you discuss any questions you have with your health care provider. Document Released: 02/29/2016 Document Revised: 04/02/2016 Document Reviewed: 02/29/2016 Elsevier Interactive Patient Education  [employer].

## 2018-07-27 NOTE — Progress Notes (Signed)
Lindsay Case is a 32 y.o. female who presents today with concerns of body aches and chills for the past 2 days. She report that it began with a sore throat that resolved and now only a mild cough persists. She states that she has taken tylenol/motrin for the body aches but has not had a fever. She denies any known sick contacts and reports that this feeling is very similar to the flu feeling she has experienced in the past when she actually had the flu.  Review of Systems  Constitutional: Positive for chills and malaise/fatigue. Negative for fever.       Body aches  HENT: Negative for congestion, ear discharge, ear pain, sinus pain and sore throat.   Eyes: Negative.   Respiratory: Negative for cough, sputum production and shortness of breath.   Cardiovascular: Negative.  Negative for chest pain.  Gastrointestinal: Negative for abdominal pain, diarrhea, nausea and vomiting.  Genitourinary: Negative for dysuria, frequency, hematuria and urgency.  Musculoskeletal: Negative for myalgias.  Skin: Negative.   Neurological: Negative for headaches.  Endo/Heme/Allergies: Negative.   Psychiatric/Behavioral: Negative.     O: Vitals:   07/27/18 1816  BP: 112/74  Pulse: 83  Resp: 20  Temp: 98.4 F (36.9 C)  SpO2: 99%     Physical Exam  Constitutional: She is oriented to person, place, and time. Vital signs are normal. She appears well-developed and well-nourished. She is active.  Non-toxic appearance. She does not have a sickly appearance.  HENT:  Head: Normocephalic.  Right Ear: Hearing, tympanic membrane, external ear and ear canal normal.  Left Ear: Hearing, tympanic membrane, external ear and ear canal normal.  Nose: Nose normal.  Mouth/Throat: Uvula is midline and oropharynx is clear and moist.  Neck: Normal range of motion. Neck supple.  Cardiovascular: Normal rate, regular rhythm, normal heart sounds and normal pulses.  Pulmonary/Chest: Effort normal and breath sounds normal.   Abdominal: Soft. Bowel sounds are normal.  Musculoskeletal: Normal range of motion.  Lymphadenopathy:       Head (right side): No submental and no submandibular adenopathy present.       Head (left side): No submental and no submandibular adenopathy present.    She has no cervical adenopathy.  Neurological: She is alert and oriented to person, place, and time.  Psychiatric: She has a normal mood and affect.  Vitals reviewed.  A: 1. Body aches   2. Flu-like symptoms     P: Discussed exam findings, diagnosis etiology and medication use and indications reviewed with patient. Follow- Up and discharge instructions provided. No emergent/urgent issues found on exam.  Patient verbalized understanding of information provided and agrees with plan of care (POC), all questions answered.  1. Body aches - POCT Influenza A/B - NEGATIVE  2. Flu-like symptoms Advised to continue antipyretic as needed for body aches and chills- Patient denies fever up to this point.

## 2018-07-29 ENCOUNTER — Telehealth: Payer: Self-pay

## 2018-07-29 NOTE — Telephone Encounter (Signed)
Patient did not answered the phone, I left a message asking to call us back.  

## 2018-08-18 ENCOUNTER — Ambulatory Visit (INDEPENDENT_AMBULATORY_CARE_PROVIDER_SITE_OTHER): Payer: 59 | Admitting: Family Medicine

## 2018-08-18 ENCOUNTER — Other Ambulatory Visit: Payer: Self-pay

## 2018-08-18 ENCOUNTER — Encounter: Payer: Self-pay | Admitting: Family Medicine

## 2018-08-18 VITALS — BP 101/72 | HR 57 | Temp 98.1°F | Resp 16 | Ht 64.0 in | Wt 132.5 lb

## 2018-08-18 DIAGNOSIS — Z0000 Encounter for general adult medical examination without abnormal findings: Secondary | ICD-10-CM

## 2018-08-18 NOTE — Progress Notes (Signed)
   Subjective:    Patient ID: Lindsay Case, female    DOB: [birth year], 32 y.o.   MRN: [id number]  HPI CPE- UTD on pap, flu shot, Tdap.  Labs from last year were fantastic.  No concerns   Review of Systems Patient reports no vision/ hearing changes, adenopathy,fever, weight change,  persistant/recurrent hoarseness , swallowing issues, chest pain, palpitations, edema, persistant/recurrent cough, hemoptysis, dyspnea (rest/exertional/paroxysmal nocturnal), gastrointestinal bleeding (melena, rectal bleeding), abdominal pain, significant heartburn, bowel changes, GU symptoms (dysuria, hematuria, incontinence), Gyn symptoms (abnormal  bleeding, pain),  syncope, focal weakness, memory loss, numbness & tingling, skin/hair/nail changes, abnormal bruising or bleeding, anxiety, or depression.     Objective:   Physical Exam General Appearance:    Alert, cooperative, no distress, appears stated age  Head:    Normocephalic, without obvious abnormality, atraumatic  Eyes:    PERRL, conjunctiva/corneas clear, EOM's intact, fundi    benign, both eyes  Ears:    Normal TM's and external ear canals, both ears  Nose:   Nares normal, septum midline, mucosa normal, no drainage    or sinus tenderness  Throat:   Lips, mucosa, and tongue normal; teeth and gums normal  Neck:   Supple, symmetrical, trachea midline, no adenopathy;    Thyroid: no enlargement/tenderness/nodules  Back:     Symmetric, no curvature, ROM normal, no CVA tenderness  Lungs:     Clear to auscultation bilaterally, respirations unlabored  Chest Wall:    No tenderness or deformity   Heart:    Regular rate and rhythm, S1 and S2 normal, no murmur, rub   or gallop  Breast Exam:    Deferred to GYN  Abdomen:     Soft, non-tender, bowel sounds active all four quadrants,    no masses, no organomegaly  Genitalia:    Deferred to GYN  Rectal:    Extremities:   Extremities normal, atraumatic, no cyanosis or edema  Pulses:   2+ and symmetric all  extremities  Skin:   Skin color, texture, turgor normal, no rashes or lesions  Lymph nodes:   Cervical, supraclavicular, and axillary nodes normal  Neurologic:   CNII-XII intact, normal strength, sensation and reflexes    throughout          Assessment & Plan:

## 2018-08-18 NOTE — Assessment & Plan Note (Signed)
Pt's PE WNL.  Labs from last year all WNL- no need to repeat.  UTD on GYN.  Anticipatory guidance provided.

## 2018-08-18 NOTE — Patient Instructions (Signed)
Follow up in 1 year or as needed We'll space out the labs to every other year unless something comes up Keep up the good work on healthy diet and regular exercise- you look great! Call with any questions or concerns Happy Fall!!!

## 2018-12-02 MED FILL — LARIN FE 1.5-30 TABLET: 1.5-30 | 84 days supply | Qty: 112 | Fill #1

## 2018-12-29 MED FILL — METHYLPREDNISOLONE 4 MG TAB: 4 | 6 days supply | Qty: 21 | Fill #0

## 2019-03-07 MED FILL — LARIN FE 1.5-30 TABLET: 1.5-30 | 84 days supply | Qty: 112 | Fill #0 | Status: TO

## 2019-04-24 ENCOUNTER — Encounter: Payer: Self-pay | Admitting: Family Medicine

## 2019-04-25 ENCOUNTER — Encounter: Payer: Self-pay | Admitting: Family Medicine

## 2019-04-25 ENCOUNTER — Other Ambulatory Visit: Payer: Self-pay

## 2019-04-25 ENCOUNTER — Ambulatory Visit (INDEPENDENT_AMBULATORY_CARE_PROVIDER_SITE_OTHER): Payer: 59 | Admitting: Family Medicine

## 2019-04-25 VITALS — Ht 63.0 in | Wt 134.0 lb

## 2019-04-25 DIAGNOSIS — F329 Major depressive disorder, single episode, unspecified: Secondary | ICD-10-CM | POA: Diagnosis not present

## 2019-04-25 DIAGNOSIS — F419 Anxiety disorder, unspecified: Secondary | ICD-10-CM | POA: Diagnosis not present

## 2019-04-25 DIAGNOSIS — F32A Depression, unspecified: Secondary | ICD-10-CM | POA: Insufficient documentation

## 2019-04-25 MED ORDER — CITALOPRAM HYDROBROMIDE 10 MG PO TABS: 10.0000 mg | ORAL_TABLET | Freq: Every day | ORAL | 3 refills | Status: DC

## 2019-04-25 MED ORDER — ALPRAZOLAM 0.5 MG PO TABS: ORAL_TABLET | ORAL | 3 refills | Status: DC

## 2019-04-25 MED FILL — CITALOPRAM HBR 10 MG TABLET: 10 | 30 days supply | Qty: 30 | Fill #0

## 2019-04-25 MED FILL — ALPRAZolam 0.5 MG TABS: 0.5 | 30 days supply | Qty: 60 | Fill #0

## 2019-04-25 NOTE — Progress Notes (Signed)
   Virtual Visit via Video   I connected with patient on 04/25/19 at  2:20 PM EDT by a video enabled telemedicine application and verified that I am speaking with the correct person using two identifiers.  Location patient: Home Location provider: Acupuncturist, Office Persons participating in the virtual visit: Patient, Provider, [place] (Jess B)  I discussed the limitations of evaluation and management by telemedicine and the availability of in person appointments. The patient expressed understanding and agreed to proceed.  Subjective:   HPI:   Anxiety- pt received notification that gym is re-opening and this 'really freaked me out' b/c she's not comfortable returning to public exercise.  Feels that anxiety has been present since teen years.  Pt reports she will have 'really down' episodes where it is hard to get out of bed, she finds herself angry.  She feels that this happens ~2x/year and can last 'days to weeks'.  Husband is able to recognize when these episodes are coming and is helpful.  Denies any sxs of mania or hypomania.  + family hx of anxiety/depression.  Pt tried EAP- didn't find it successful.  ROS:   See pertinent positives and negatives per HPI.  Patient Active Problem List   Diagnosis Date Noted  . Physical exam 08/12/2017  . Pre-syncope 08/12/2017    Social History   Tobacco Use  . Smoking status: Never Smoker  . Smokeless tobacco: Never Used  Substance Use Topics  . Alcohol use: Yes    Alcohol/week: 3.0 standard drinks    Types: 3 Standard drinks or equivalent per week    Current Outpatient Medications:  .  norethindrone-ethinyl estradiol-iron (MICROGESTIN FE,GILDESS FE,LOESTRIN FE) 1.5-30 MG-MCG tablet, Take 1 tablet by mouth daily., Disp: , Rfl:  .  ondansetron (ZOFRAN) 4 MG tablet, Take 4 mg by mouth every 8 (eight) hours as needed for nausea or vomiting., Disp: , Rfl:   No Known Allergies  Objective:   Ht 5\' 3"  (1.6 m)   Wt 134 lb (60.8 kg)    BMI 23.74 kg/m   AAOx3, NAD NCAT, EOMI No obvious CN deficits Coloring WNL Pt is able to speak clearly, coherently without shortness of breath or increased work of breathing.  Thought process is linear.  Mood is appropriate.   Assessment and Plan:   Anxiety/depression- new to provider, ongoing for pt but she has never talked about it.  She is very anxious w/ COVID, political climate, etc which she is afraid will trigger her depression.  She had a terrible time during the election of 2016 and is doesn't want to go back to that dark place.  Will start low dose SSRI, refer for counseling, and use alprazolam prn panicked moments- including flights.  Pt expressed understanding and is in agreement w/ plan.    Annye Asa, MD 04/25/2019

## 2019-04-25 NOTE — Progress Notes (Signed)
I have discussed the procedure for the virtual visit with the patient who has given consent to proceed with assessment and treatment.   Lindsay Case Lindsay Case Lindsay Case, CMA     

## 2019-04-28 ENCOUNTER — Telehealth: Payer: Self-pay | Admitting: *Deleted

## 2019-04-28 NOTE — Telephone Encounter (Signed)
LM for patient to call to schedule 3-4 week followup with PCP.  Appt would be around July 9th.

## 2019-05-05 ENCOUNTER — Ambulatory Visit: Payer: 59 | Admitting: Professional

## 2019-05-23 MED FILL — CITALOPRAM HBR 10 MG TABLET: 10 | 30 days supply | Qty: 30 | Fill #1

## 2019-06-20 MED FILL — CITALOPRAM HBR 10 MG TABLET: 10 | 30 days supply | Qty: 30 | Fill #2

## 2019-06-23 MED FILL — LARIN FE 1.5-30 TABLET: 1.5-30 | 84 days supply | Qty: 112 | Fill #0

## 2019-07-14 ENCOUNTER — Encounter: Payer: Self-pay | Admitting: Family Medicine

## 2019-07-19 ENCOUNTER — Ambulatory Visit (INDEPENDENT_AMBULATORY_CARE_PROVIDER_SITE_OTHER): Payer: 59 | Admitting: Family Medicine

## 2019-07-19 ENCOUNTER — Encounter: Payer: Self-pay | Admitting: Family Medicine

## 2019-07-19 ENCOUNTER — Other Ambulatory Visit: Payer: Self-pay

## 2019-07-19 VITALS — Temp 98.8°F | Ht 63.0 in | Wt 138.0 lb

## 2019-07-19 DIAGNOSIS — F32A Depression, unspecified: Secondary | ICD-10-CM

## 2019-07-19 DIAGNOSIS — F419 Anxiety disorder, unspecified: Secondary | ICD-10-CM | POA: Diagnosis not present

## 2019-07-19 DIAGNOSIS — F329 Major depressive disorder, single episode, unspecified: Secondary | ICD-10-CM | POA: Diagnosis not present

## 2019-07-19 MED ORDER — CITALOPRAM HYDROBROMIDE 20 MG PO TABS: 20.0000 mg | ORAL_TABLET | Freq: Every day | ORAL | 3 refills | Status: DC

## 2019-07-19 MED FILL — CITALOPRAM HBR 20 MG TABLET: 20 | 30 days supply | Qty: 30 | Fill #0

## 2019-07-19 NOTE — Progress Notes (Signed)
I have discussed the procedure for the virtual visit with the patient who has given consent to proceed with assessment and treatment.   Pt would like to see if celexa could be increased. Feels as though it has tapered of and is not as therapeutic.   Davis Gourd, CMA

## 2019-07-19 NOTE — Progress Notes (Signed)
   Virtual Visit via Video   I connected with patient on 07/19/19 at  8:30 AM EDT by a video enabled telemedicine application and verified that I am speaking with the correct person using two identifiers.  Location patient: Home Location provider: Acupuncturist, Office Persons participating in the virtual visit: Patient, Provider, [place] (Jess B)  I discussed the limitations of evaluation and management by telemedicine and the availability of in person appointments. The patient expressed understanding and agreed to proceed.  Subjective:   HPI:   Anxiety/Depression- pt was started on Citalopram 10mg  on 6/22.  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  Pt reports the 1st week of medication 'was weird'.  'I almost felt drunk'.  After the first week, 'it was great'- husband noticed, coworkers noticed.  For 6 weeks things were great but for the last few weeks doesn't feel med is effective.  Pt initially had heartburn (which was new for her) but not recently.  'no major difference' in sleep pattern- able to fall asleep, some difficulty staying asleep.  ROS:   See pertinent positives and negatives per HPI.  Patient Active Problem List   Diagnosis Date Noted  . Anxiety and depression 04/25/2019  . Physical exam 08/12/2017  . Pre-syncope 08/12/2017    Social History   Tobacco Use  . Smoking status: Never Smoker  . Smokeless tobacco: Never Used  Substance Use Topics  . Alcohol use: Yes    Alcohol/week: 3.0 standard drinks    Types: 3 Standard drinks or equivalent per week    Current Outpatient Medications:  .  ALPRAZolam (XANAX) 0.5 MG tablet, 1/2-1 tab BID prn panic/flights, Disp: 60 tablet, Rfl: 3 .  citalopram (CELEXA) 10 MG tablet, Take 1 tablet (10 mg total) by mouth daily., Disp: 30 tablet, Rfl: 3 .  norethindrone-ethinyl estradiol-iron (MICROGESTIN FE,GILDESS FE,LOESTRIN FE) 1.5-30 MG-MCG tablet, Take 1  tablet by mouth daily., Disp: , Rfl:  .  ondansetron (ZOFRAN) 4 MG tablet, Take 4 mg by mouth every 8 (eight) hours as needed for nausea or vomiting., Disp: , Rfl:   No Known Allergies  Objective:   Temp 98.8 F (37.1 C) (Oral)   Ht 5\' 3"  (1.6 m)   Wt 138 lb (62.6 kg)   BMI 24.45 kg/m   AAOx3, NAD NCAT, EOMI No obvious CN deficits Coloring WNL Pt is able to speak clearly, coherently without shortness of breath or increased work of breathing.  Thought process is linear.  Mood is appropriate.   Assessment and Plan:   Anxiety/Depression- pt had a rapid response to starting SSRI and felt great for 6 weeks until efficacy waned and she feels 'back to my grumpy self'.  Based on this, will increase Citalopram to 20mg  daily and monitor for symptom improvement but also watch for possible side effects (weight gain, etc).  She has CPE in 1 month and we will discuss how things are going at that time.  Pt expressed understanding and is in agreement w/ plan.    Annye Asa, MD 07/19/2019

## 2019-07-26 ENCOUNTER — Ambulatory Visit: Payer: 59 | Admitting: Family Medicine

## 2019-07-26 DIAGNOSIS — Z124 Encounter for screening for malignant neoplasm of cervix: Secondary | ICD-10-CM | POA: Diagnosis not present

## 2019-07-26 DIAGNOSIS — Z01419 Encounter for gynecological examination (general) (routine) without abnormal findings: Secondary | ICD-10-CM | POA: Diagnosis not present

## 2019-07-26 LAB — HM PAP SMEAR

## 2019-07-28 ENCOUNTER — Encounter: Payer: Self-pay | Admitting: General Practice

## 2019-08-17 MED FILL — CITALOPRAM HBR 20 MG TABLET: 20 | 30 days supply | Qty: 30 | Fill #1

## 2019-08-22 ENCOUNTER — Other Ambulatory Visit: Payer: Self-pay

## 2019-08-22 ENCOUNTER — Encounter: Payer: Self-pay | Admitting: Family Medicine

## 2019-08-22 ENCOUNTER — Ambulatory Visit (INDEPENDENT_AMBULATORY_CARE_PROVIDER_SITE_OTHER): Payer: 59 | Admitting: Family Medicine

## 2019-08-22 VITALS — BP 119/70 | HR 68 | Temp 98.1°F | Resp 16 | Ht 63.0 in | Wt 137.4 lb

## 2019-08-22 DIAGNOSIS — Z0000 Encounter for general adult medical examination without abnormal findings: Secondary | ICD-10-CM

## 2019-08-22 NOTE — Patient Instructions (Signed)
Follow up in 1 year or as needed We'll notify you of your lab results and make any changes if needed Continue to work on healthy diet and regular exercise- you look great! Call with any questions or concerns Try [employer] for the motion sickness Call with any questions or concerns Stay Safe!!!

## 2019-08-22 NOTE — Assessment & Plan Note (Signed)
Pt's PE WNL.  UTD on GYN, immunizations.  Check labs.  Anticipatory guidance provided.  

## 2019-08-22 NOTE — Progress Notes (Signed)
   Subjective:    Patient ID: Lindsay Case, female    DOB: [birth year], 33 y.o.   MRN: [id number]  HPI CPE- UTD on flu shot, pap smear, Tdap.   Review of Systems Patient reports no vision/ hearing changes, adenopathy,fever, weight change,  persistant/recurrent hoarseness , swallowing issues, chest pain, palpitations, edema, persistant/recurrent cough, hemoptysis, dyspnea (rest/exertional/paroxysmal nocturnal), gastrointestinal bleeding (melena, rectal bleeding), abdominal pain, significant heartburn, bowel changes, GU symptoms (dysuria, hematuria, incontinence), Gyn symptoms (abnormal  bleeding, pain),  syncope, focal weakness, memory loss, numbness & tingling, skin/hair/nail changes, abnormal bruising or bleeding, anxiety, or depression.   + nausea if she takes Citalopram 1-2 hrs late.  Pt reports feeling 'a lot better' on medication and husband is very pleased.    Objective:   Physical Exam General Appearance:    Alert, cooperative, no distress, appears stated age  Head:    Normocephalic, without obvious abnormality, atraumatic  Eyes:    PERRL, conjunctiva/corneas clear, EOM's intact, fundi    benign, both eyes  Ears:    Normal TM's and external ear canals, both ears  Nose:   Deferred due to COVID  Throat:   Neck:   Supple, symmetrical, trachea midline, no adenopathy;    Thyroid: no enlargement/tenderness/nodules  Back:     Symmetric, no curvature, ROM normal, no CVA tenderness  Lungs:     Clear to auscultation bilaterally, respirations unlabored  Chest Wall:    No tenderness or deformity   Heart:    Regular rate and rhythm, S1 and S2 normal, no murmur, rub   or gallop  Breast Exam:    Deferred to GYN  Abdomen:     Soft, non-tender, bowel sounds active all four quadrants,    no masses, no organomegaly  Genitalia:    Deferred to GYN  Rectal:    Extremities:   Extremities normal, atraumatic, no cyanosis or edema  Pulses:   2+ and symmetric all extremities  Skin:   Skin color,  texture, turgor normal, no rashes or lesions  Lymph nodes:   Cervical, supraclavicular, and axillary nodes normal  Neurologic:   CNII-XII intact, normal strength, sensation and reflexes    throughout          Assessment & Plan:

## 2019-08-23 LAB — LIPID PANEL
Cholesterol: 165 mg/dL (ref 0–200)
HDL: 65.3 mg/dL (ref >39.00)
LDL Cholesterol: 85 mg/dL (ref 0–99)
NonHDL: 99.48
Total CHOL/HDL Ratio: 3
Triglycerides: 70 mg/dL (ref 0.0–149.0)
VLDL: 14 mg/dL (ref 0.0–40.0)

## 2019-08-23 LAB — CBC WITH DIFFERENTIAL/PLATELET
Basophils Absolute: 0.1 10*3/uL (ref 0.0–0.1)
Basophils Relative: 1.1 % (ref 0.0–3.0)
Eosinophils Absolute: 0.2 10*3/uL (ref 0.0–0.7)
Eosinophils Relative: 2.6 % (ref 0.0–5.0)
HCT: 41.7 % (ref 36.0–46.0)
Hemoglobin: 13.8 g/dL (ref 12.0–15.0)
Lymphocytes Relative: 33.4 % (ref 12.0–46.0)
Lymphs Abs: 2.4 10*3/uL (ref 0.7–4.0)
MCHC: 33.1 g/dL (ref 30.0–36.0)
MCV: 93.2 fl (ref 78.0–100.0)
Monocytes Absolute: 0.6 10*3/uL (ref 0.1–1.0)
Monocytes Relative: 8.5 % (ref 3.0–12.0)
Neutro Abs: 3.9 10*3/uL (ref 1.4–7.7)
Neutrophils Relative %: 54.4 % (ref 43.0–77.0)
Platelets: 245 10*3/uL (ref 150.0–400.0)
RBC: 4.47 Mil/uL (ref 3.87–5.11)
RDW: 12.4 % (ref 11.5–15.5)
WBC: 7.1 10*3/uL (ref 4.0–10.5)

## 2019-08-23 LAB — BASIC METABOLIC PANEL
BUN: 18 mg/dL (ref 6–23)
CO2: 25 mEq/L (ref 19–32)
Calcium: 9.3 mg/dL (ref 8.4–10.5)
Chloride: 104 mEq/L (ref 96–112)
Creatinine, Ser: 0.88 mg/dL (ref 0.40–1.20)
GFR: 73.61 mL/min (ref >60.00)
Glucose, Bld: 75 mg/dL (ref 70–99)
Potassium: 4.1 mEq/L (ref 3.5–5.1)
Sodium: 136 mEq/L (ref 135–145)

## 2019-08-23 LAB — HEPATIC FUNCTION PANEL
ALT: 18 U/L (ref 0–35)
AST: 26 U/L (ref 0–37)
Albumin: 4.6 g/dL (ref 3.5–5.2)
Alkaline Phosphatase: 39 U/L (ref 39–117)
Bilirubin, Direct: 0.1 mg/dL (ref 0.0–0.3)
Total Bilirubin: 0.4 mg/dL (ref 0.2–1.2)
Total Protein: 6.8 g/dL (ref 6.0–8.3)

## 2019-08-23 LAB — TSH: TSH: 1.7 u[IU]/mL (ref 0.35–4.50)

## 2019-09-16 MED FILL — CITALOPRAM HBR 20 MG TABLET: 20 | 30 days supply | Qty: 30 | Fill #2

## 2019-10-03 DIAGNOSIS — Z03818 Encounter for observation for suspected exposure to other biological agents ruled out: Secondary | ICD-10-CM | POA: Diagnosis not present

## 2019-10-14 MED FILL — CITALOPRAM HBR 20 MG TABLET: 20 | 30 days supply | Qty: 30 | Fill #3

## 2019-11-03 MED FILL — LARIN FE 1.5-30 TABLET: 1.5-30 | 84 days supply | Qty: 112 | Fill #0

## 2019-11-14 ENCOUNTER — Other Ambulatory Visit: Payer: Self-pay | Admitting: Family Medicine

## 2019-11-14 MED FILL — CITALOPRAM HBR 20 MG TABLET: 20 | 30 days supply | Qty: 30 | Fill #0

## 2019-12-15 ENCOUNTER — Ambulatory Visit (INDEPENDENT_AMBULATORY_CARE_PROVIDER_SITE_OTHER): Payer: 59 | Admitting: Family Medicine

## 2019-12-15 ENCOUNTER — Other Ambulatory Visit: Payer: Self-pay

## 2019-12-15 ENCOUNTER — Encounter: Payer: Self-pay | Admitting: Family Medicine

## 2019-12-15 VITALS — Ht 63.0 in | Wt 142.0 lb

## 2019-12-15 DIAGNOSIS — F32A Depression, unspecified: Secondary | ICD-10-CM

## 2019-12-15 DIAGNOSIS — F329 Major depressive disorder, single episode, unspecified: Secondary | ICD-10-CM | POA: Diagnosis not present

## 2019-12-15 DIAGNOSIS — F419 Anxiety disorder, unspecified: Secondary | ICD-10-CM

## 2019-12-15 MED ORDER — FLUOXETINE HCL 20 MG PO CAPS: 20.0000 mg | ORAL_CAPSULE | Freq: Every day | ORAL | 3 refills | Status: DC

## 2019-12-15 MED FILL — FLUoxetine HCL 20 MG CAPS: 20 | 30 days supply | Qty: 30 | Fill #0

## 2019-12-15 NOTE — Progress Notes (Signed)
I have discussed the procedure for the virtual visit with the patient who has given consent to proceed with assessment and treatment.   Pt feels that the 20mg  Celexa is working but she has gained weight while on it.   Davis Gourd, CMA

## 2019-12-15 NOTE — Progress Notes (Signed)
   Virtual Visit via Video   I connected with patient on 12/15/19 at 11:30 AM EST by a video enabled telemedicine application and verified that I am speaking with the correct person using two identifiers.  Location patient: Home Location provider: Acupuncturist, Office Persons participating in the virtual visit: Patient, Provider, [place] (Jess B)  I discussed the limitations of evaluation and management by telemedicine and the availability of in person appointments. The patient expressed understanding and agreed to proceed.  Subjective:   HPI:   Anxiety/Depression- pt currently on Citalopram 20mg  daily.  'i've really enjoyed this medication and it's made a huge difference in my life'.  Pt reports weight gain- 8 lbs since starting in June.  Pt missed a dose and noticed a big difference, 'I felt horrible'.  Has appt upcoming w/ counselor.  ROS:   See pertinent positives and negatives per HPI.  Patient Active Problem List   Diagnosis Date Noted  . Anxiety and depression 04/25/2019  . Physical exam 08/12/2017  . Pre-syncope 08/12/2017    Social History   Tobacco Use  . Smoking status: Never Smoker  . Smokeless tobacco: Never Used  Substance Use Topics  . Alcohol use: Yes    Alcohol/week: 3.0 standard drinks    Types: 3 Standard drinks or equivalent per week    Current Outpatient Medications:  .  citalopram (CELEXA) 20 MG tablet, TAKE 1 TABLET (20 MG TOTAL) BY MOUTH DAILY., Disp: 30 tablet, Rfl: 3 .  norethindrone-ethinyl estradiol-iron (MICROGESTIN FE,GILDESS FE,LOESTRIN FE) 1.5-30 MG-MCG tablet, Take 1 tablet by mouth daily., Disp: , Rfl:  .  ondansetron (ZOFRAN) 4 MG tablet, Take 4 mg by mouth every 8 (eight) hours as needed for nausea or vomiting., Disp: , Rfl:  .  ALPRAZolam (XANAX) 0.5 MG tablet, 1/2-1 tab BID prn panic/flights (Patient not taking: Reported on 08/22/2019), Disp: 60 tablet, Rfl: 3  No Known Allergies  Objective:   Ht 5\' 3"  (1.6 m)   Wt 142 lb  (64.4 kg)   BMI 25.15 kg/m   AAOx3, NAD NCAT, EOMI No obvious CN deficits Coloring WNL Pt is able to speak clearly, coherently without shortness of breath or increased work of breathing.  Thought process is linear.  Mood is appropriate.   Assessment and Plan:   Anxiety/depression- much improved w/ the Citalopram but pt is concerned about the associated weight gain.  She is up 8 lbs from when she started.  Will switch to Prozac and see if depression and anxiety remain well controlled and if weight stabilizes.  Pt expressed understanding and is in agreement w/ plan.    Annye Asa, MD 12/15/2019

## 2019-12-19 ENCOUNTER — Telehealth: Payer: Self-pay | Admitting: Family Medicine

## 2019-12-19 NOTE — Telephone Encounter (Signed)
LM asking pt to call back to scheduled a 3-4 wk reck on mood from appt on 12/15/2019

## 2019-12-20 ENCOUNTER — Ambulatory Visit (INDEPENDENT_AMBULATORY_CARE_PROVIDER_SITE_OTHER): Payer: 59 | Admitting: Professional

## 2019-12-20 DIAGNOSIS — F331 Major depressive disorder, recurrent, moderate: Secondary | ICD-10-CM | POA: Diagnosis not present

## 2020-01-10 ENCOUNTER — Ambulatory Visit: Payer: 59 | Admitting: Family Medicine

## 2020-01-10 ENCOUNTER — Ambulatory Visit (INDEPENDENT_AMBULATORY_CARE_PROVIDER_SITE_OTHER): Payer: 59 | Admitting: Professional

## 2020-01-10 DIAGNOSIS — F331 Major depressive disorder, recurrent, moderate: Secondary | ICD-10-CM | POA: Diagnosis not present

## 2020-01-12 ENCOUNTER — Other Ambulatory Visit: Payer: Self-pay

## 2020-01-12 ENCOUNTER — Encounter: Payer: Self-pay | Admitting: Family Medicine

## 2020-01-12 ENCOUNTER — Ambulatory Visit (INDEPENDENT_AMBULATORY_CARE_PROVIDER_SITE_OTHER): Payer: 59 | Admitting: Family Medicine

## 2020-01-12 VITALS — Ht 63.0 in | Wt 142.0 lb

## 2020-01-12 DIAGNOSIS — F419 Anxiety disorder, unspecified: Secondary | ICD-10-CM

## 2020-01-12 DIAGNOSIS — F329 Major depressive disorder, single episode, unspecified: Secondary | ICD-10-CM | POA: Diagnosis not present

## 2020-01-12 DIAGNOSIS — F32A Depression, unspecified: Secondary | ICD-10-CM

## 2020-01-12 MED ORDER — FLUOXETINE HCL 40 MG PO CAPS: 40.0000 mg | ORAL_CAPSULE | Freq: Every day | ORAL | 3 refills | Status: DC

## 2020-01-12 MED FILL — FLUoxetine HCL 40 MG CAPS: 40 | 30 days supply | Qty: 30 | Fill #0

## 2020-01-12 NOTE — Progress Notes (Signed)
I have discussed the procedure for the virtual visit with the patient who has given consent to proceed with assessment and treatment.   Pt unable to obtain vitals.   Lindsay Case, CMA     

## 2020-01-12 NOTE — Progress Notes (Signed)
   Virtual Visit via Video   I connected with patient on 01/12/20 at 11:30 AM EST by a video enabled telemedicine application and verified that I am speaking with the correct person using two identifiers.  Location patient: Home Location provider: Acupuncturist, Office Persons participating in the virtual visit: Patient, Provider, [place] (Jess B)  I discussed the limitations of evaluation and management by telemedicine and the availability of in person appointments. The patient expressed understanding and agreed to proceed.  Subjective:   HPI:   Anxiety/Depression- at last visit pt reported Citalopram was helping but she was concerned about the 8 lb weight gain that accompanied it.  We switched to Prozac at that time.  Pt feels that she feels more 'swings' on Prozac.  Family and friends notice improvement.  Pt reports she can still be easily triggered at work but overall, things are better.  Weight is stable despite not exercising.    ROS:   See pertinent positives and negatives per HPI.  Patient Active Problem List   Diagnosis Date Noted  . Anxiety and depression 04/25/2019  . Physical exam 08/12/2017  . Pre-syncope 08/12/2017    Social History   Tobacco Use  . Smoking status: Never Smoker  . Smokeless tobacco: Never Used  Substance Use Topics  . Alcohol use: Yes    Alcohol/week: 3.0 standard drinks    Types: 3 Standard drinks or equivalent per week    Current Outpatient Medications:  .  FLUoxetine (PROZAC) 20 MG capsule, Take 1 capsule (20 mg total) by mouth daily., Disp: 30 capsule, Rfl: 3 .  norethindrone-ethinyl estradiol-iron (MICROGESTIN FE,GILDESS FE,LOESTRIN FE) 1.5-30 MG-MCG tablet, Take 1 tablet by mouth daily., Disp: , Rfl:  .  ALPRAZolam (XANAX) 0.5 MG tablet, 1/2-1 tab BID prn panic/flights (Patient not taking: Reported on 08/22/2019), Disp: 60 tablet, Rfl: 3 .  ondansetron (ZOFRAN) 4 MG tablet, Take 4 mg by mouth every 8 (eight) hours as needed for nausea  or vomiting., Disp: , Rfl:   No Known Allergies  Objective:   Ht 5\' 3"  (1.6 m)   Wt 142 lb (64.4 kg)   BMI 25.15 kg/m   AAOx3, NAD NCAT, EOMI No obvious CN deficits Coloring WNL Pt is able to speak clearly, coherently without shortness of breath or increased work of breathing.  Thought process is linear.  Mood is appropriate.   Assessment and Plan:   Anxiety/Depression- improving but pt reports she still has triggers at work that will 'set me off when they shouldn't'.  She is interested in increasing to 40mg  daily to see if she can have better sxs control.  New prescription sent and will continue to monitor closely.   Annye Asa, MD 01/12/2020

## 2020-01-24 ENCOUNTER — Ambulatory Visit (INDEPENDENT_AMBULATORY_CARE_PROVIDER_SITE_OTHER): Payer: 59 | Admitting: Professional

## 2020-01-24 DIAGNOSIS — F331 Major depressive disorder, recurrent, moderate: Secondary | ICD-10-CM

## 2020-02-07 ENCOUNTER — Ambulatory Visit (INDEPENDENT_AMBULATORY_CARE_PROVIDER_SITE_OTHER): Payer: 59 | Admitting: Professional

## 2020-02-07 DIAGNOSIS — F331 Major depressive disorder, recurrent, moderate: Secondary | ICD-10-CM

## 2020-02-10 MED FILL — FLUoxetine HCL 40 MG CAPS: 40 | 30 days supply | Qty: 30 | Fill #1

## 2020-02-16 ENCOUNTER — Telehealth (INDEPENDENT_AMBULATORY_CARE_PROVIDER_SITE_OTHER): Payer: 59 | Admitting: Family Medicine

## 2020-02-16 ENCOUNTER — Other Ambulatory Visit: Payer: Self-pay

## 2020-02-16 ENCOUNTER — Encounter: Payer: Self-pay | Admitting: Family Medicine

## 2020-02-16 VITALS — Ht 63.0 in | Wt 145.0 lb

## 2020-02-16 DIAGNOSIS — F419 Anxiety disorder, unspecified: Secondary | ICD-10-CM | POA: Diagnosis not present

## 2020-02-16 DIAGNOSIS — F32A Depression, unspecified: Secondary | ICD-10-CM

## 2020-02-16 DIAGNOSIS — F329 Major depressive disorder, single episode, unspecified: Secondary | ICD-10-CM

## 2020-02-16 MED ORDER — ESCITALOPRAM OXALATE 10 MG PO TABS: 10.0000 mg | ORAL_TABLET | Freq: Every day | ORAL | 3 refills | Status: DC

## 2020-02-16 MED FILL — ESCITALOPRAM 10 MG TABLET: 10 | 30 days supply | Qty: 30 | Fill #0

## 2020-02-16 NOTE — Progress Notes (Signed)
   Virtual Visit via Video   I connected with patient on 02/16/20 at  4:00 PM EDT by a video enabled telemedicine application and verified that I am speaking with the correct person using two identifiers.  Location patient: Home Location provider: Acupuncturist, Office Persons participating in the virtual visit: Patient, Provider, [place] (Jess B)  I discussed the limitations of evaluation and management by telemedicine and the availability of in person appointments. The patient expressed understanding and agreed to proceed.  Subjective:   HPI:   Anxiety/Depression- at last visit pt was transitioned from Citalopram to Fluoxetine.  'it is horrible'.  'I can fall asleep any time regardless of how well rested I am'.  Reports she is having vivid nightmares, unable to feel.  Pt reports she felt 'really good' on Citalopram but was concerned about weight gain.  At 20mg  Prozac didn't notice any improvement and now feels 'terrible' on 40mg .      ROS:   See pertinent positives and negatives per HPI.  Patient Active Problem List   Diagnosis Date Noted  . Anxiety and depression 04/25/2019  . Physical exam 08/12/2017  . Pre-syncope 08/12/2017    Social History   Tobacco Use  . Smoking status: Never Smoker  . Smokeless tobacco: Never Used  Substance Use Topics  . Alcohol use: Yes    Alcohol/week: 3.0 standard drinks    Types: 3 Standard drinks or equivalent per week    Current Outpatient Medications:  .  FLUoxetine (PROZAC) 40 MG capsule, Take 1 capsule (40 mg total) by mouth daily., Disp: 30 capsule, Rfl: 3 .  norethindrone-ethinyl estradiol-iron (MICROGESTIN FE,GILDESS FE,LOESTRIN FE) 1.5-30 MG-MCG tablet, Take 1 tablet by mouth daily., Disp: , Rfl:  .  ALPRAZolam (XANAX) 0.5 MG tablet, 1/2-1 tab BID prn panic/flights (Patient not taking: Reported on 08/22/2019), Disp: 60 tablet, Rfl: 3 .  ondansetron (ZOFRAN) 4 MG tablet, Take 4 mg by mouth every 8 (eight) hours as needed for nausea  or vomiting., Disp: , Rfl:   No Known Allergies  Objective:   Ht 5\' 3"  (1.6 m)   Wt 145 lb (65.8 kg)   BMI 25.69 kg/m  AAOx3, NAD NCAT, EOMI No obvious CN deficits Coloring WNL Pt is able to speak clearly, coherently without shortness of breath or increased work of breathing.  Thought process is linear.  Mood is appropriate.   Assessment and Plan:   Anxiety and depression- Deteriorated.  pt wasn't feeling anything on Prozac 20mg  and now is feeling 'terrible' on the 40mg .  Was feeling good on the Citalopram but wanted to change due to weight gain.  Will switch to Lexapro as this is an isomer of the Citalopram and will hopefully provide the benefit w/o the weight gain.  Will follow closely.   Annye Asa, MD 02/16/2020

## 2020-02-16 NOTE — Progress Notes (Signed)
I have discussed the procedure for the virtual visit with the patient who has given consent to proceed with assessment and treatment.   Telly Broberg L Sarahlynn Cisnero, CMA     

## 2020-02-21 ENCOUNTER — Ambulatory Visit: Payer: 59 | Admitting: Family Medicine

## 2020-02-21 ENCOUNTER — Ambulatory Visit (INDEPENDENT_AMBULATORY_CARE_PROVIDER_SITE_OTHER): Payer: 59 | Admitting: Professional

## 2020-02-21 DIAGNOSIS — F331 Major depressive disorder, recurrent, moderate: Secondary | ICD-10-CM | POA: Diagnosis not present

## 2020-02-24 MED FILL — LARIN FE 1.5-30 TABLET: 1.5-30 | 84 days supply | Qty: 112 | Fill #1

## 2020-03-08 ENCOUNTER — Ambulatory Visit (INDEPENDENT_AMBULATORY_CARE_PROVIDER_SITE_OTHER): Payer: 59 | Admitting: Professional

## 2020-03-08 DIAGNOSIS — F331 Major depressive disorder, recurrent, moderate: Secondary | ICD-10-CM

## 2020-03-20 ENCOUNTER — Ambulatory Visit (INDEPENDENT_AMBULATORY_CARE_PROVIDER_SITE_OTHER): Payer: 59 | Admitting: Professional

## 2020-03-20 DIAGNOSIS — F331 Major depressive disorder, recurrent, moderate: Secondary | ICD-10-CM | POA: Diagnosis not present

## 2020-03-22 ENCOUNTER — Telehealth: Payer: 59 | Admitting: Family Medicine

## 2020-03-29 ENCOUNTER — Ambulatory Visit: Payer: 59 | Admitting: Professional

## 2020-04-16 MED FILL — ESCITALOPRAM 10 MG TABLET: 10 | 30 days supply | Qty: 30 | Fill #2

## 2020-04-17 ENCOUNTER — Ambulatory Visit: Payer: 59 | Admitting: Professional

## 2020-05-15 ENCOUNTER — Ambulatory Visit (INDEPENDENT_AMBULATORY_CARE_PROVIDER_SITE_OTHER): Payer: 59 | Admitting: Professional

## 2020-05-15 DIAGNOSIS — F331 Major depressive disorder, recurrent, moderate: Secondary | ICD-10-CM | POA: Diagnosis not present

## 2020-05-18 MED FILL — ESCITALOPRAM 10 MG TABLET: 10 | 30 days supply | Qty: 30 | Fill #3

## 2020-06-13 MED FILL — LARIN FE 1.5-30 TABLET: 1.5-30 | 84 days supply | Qty: 112 | Fill #2

## 2020-07-02 ENCOUNTER — Other Ambulatory Visit: Payer: Self-pay | Admitting: Family Medicine

## 2020-07-02 MED FILL — ESCITALOPRAM 10 MG TABLET: 10 | 30 days supply | Qty: 30 | Fill #0

## 2020-07-14 DIAGNOSIS — Z20828 Contact with and (suspected) exposure to other viral communicable diseases: Secondary | ICD-10-CM | POA: Diagnosis not present

## 2020-07-30 MED FILL — ESCITALOPRAM 10 MG TABLET: 10 | 30 days supply | Qty: 30 | Fill #1

## 2020-08-07 ENCOUNTER — Other Ambulatory Visit (HOSPITAL_COMMUNITY): Payer: Self-pay | Admitting: Obstetrics and Gynecology

## 2020-08-07 DIAGNOSIS — Z01419 Encounter for gynecological examination (general) (routine) without abnormal findings: Secondary | ICD-10-CM | POA: Diagnosis not present

## 2020-09-19 MED FILL — LARIN FE 1.5-30 TABLET: 1.5-30 | 84 days supply | Qty: 112 | Fill #0

## 2020-10-01 ENCOUNTER — Encounter: Payer: 59 | Admitting: Family Medicine

## 2020-10-10 ENCOUNTER — Encounter: Payer: 59 | Admitting: Family Medicine

## 2020-11-07 ENCOUNTER — Encounter: Payer: Self-pay | Admitting: Family Medicine

## 2020-11-07 ENCOUNTER — Ambulatory Visit (INDEPENDENT_AMBULATORY_CARE_PROVIDER_SITE_OTHER): Payer: 59 | Admitting: Family Medicine

## 2020-11-07 ENCOUNTER — Other Ambulatory Visit: Payer: Self-pay

## 2020-11-07 ENCOUNTER — Other Ambulatory Visit: Payer: Self-pay | Admitting: Family Medicine

## 2020-11-07 VITALS — BP 118/70 | HR 72 | Temp 97.5°F | Resp 19 | Ht 64.0 in | Wt 155.6 lb

## 2020-11-07 DIAGNOSIS — F419 Anxiety disorder, unspecified: Secondary | ICD-10-CM

## 2020-11-07 DIAGNOSIS — E663 Overweight: Secondary | ICD-10-CM | POA: Insufficient documentation

## 2020-11-07 DIAGNOSIS — Z0000 Encounter for general adult medical examination without abnormal findings: Secondary | ICD-10-CM

## 2020-11-07 DIAGNOSIS — F32A Depression, unspecified: Secondary | ICD-10-CM

## 2020-11-07 LAB — CBC WITH DIFFERENTIAL/PLATELET
Basophils Absolute: 0 10*3/uL (ref 0.0–0.1)
Basophils Relative: 0.4 % (ref 0.0–3.0)
Eosinophils Absolute: 0.1 10*3/uL (ref 0.0–0.7)
Eosinophils Relative: 1.7 % (ref 0.0–5.0)
HCT: 42.2 % (ref 36.0–46.0)
Hemoglobin: 14.2 g/dL (ref 12.0–15.0)
Lymphocytes Relative: 39.9 % (ref 12.0–46.0)
Lymphs Abs: 1.8 10*3/uL (ref 0.7–4.0)
MCHC: 33.6 g/dL (ref 30.0–36.0)
MCV: 90.2 fl (ref 78.0–100.0)
Monocytes Absolute: 0.4 10*3/uL (ref 0.1–1.0)
Monocytes Relative: 9.5 % (ref 3.0–12.0)
Neutro Abs: 2.2 10*3/uL (ref 1.4–7.7)
Neutrophils Relative %: 48.5 % (ref 43.0–77.0)
Platelets: 248 10*3/uL (ref 150.0–400.0)
RBC: 4.68 Mil/uL (ref 3.87–5.11)
RDW: 12.6 % (ref 11.5–15.5)
WBC: 4.6 10*3/uL (ref 4.0–10.5)

## 2020-11-07 LAB — BASIC METABOLIC PANEL
BUN: 15 mg/dL (ref 6–23)
CO2: 26 mEq/L (ref 19–32)
Calcium: 9.1 mg/dL (ref 8.4–10.5)
Chloride: 104 mEq/L (ref 96–112)
Creatinine, Ser: 0.86 mg/dL (ref 0.40–1.20)
GFR: 87.73 mL/min (ref >60.00)
Glucose, Bld: 99 mg/dL (ref 70–99)
Potassium: 4 mEq/L (ref 3.5–5.1)
Sodium: 135 mEq/L (ref 135–145)

## 2020-11-07 LAB — HEPATIC FUNCTION PANEL
ALT: 19 U/L (ref 0–35)
AST: 18 U/L (ref 0–37)
Albumin: 4.5 g/dL (ref 3.5–5.2)
Alkaline Phosphatase: 35 U/L — ABNORMAL LOW (ref 39–117)
Bilirubin, Direct: 0.1 mg/dL (ref 0.0–0.3)
Total Bilirubin: 0.6 mg/dL (ref 0.2–1.2)
Total Protein: 6.9 g/dL (ref 6.0–8.3)

## 2020-11-07 LAB — TSH: TSH: 2 u[IU]/mL (ref 0.35–4.50)

## 2020-11-07 LAB — LIPID PANEL
Cholesterol: 183 mg/dL (ref 0–200)
HDL: 55 mg/dL (ref >39.00)
LDL Cholesterol: 108 mg/dL — ABNORMAL HIGH (ref 0–99)
NonHDL: 127.98
Total CHOL/HDL Ratio: 3
Triglycerides: 98 mg/dL (ref 0.0–149.0)
VLDL: 19.6 mg/dL (ref 0.0–40.0)

## 2020-11-07 MED ORDER — BUPROPION HCL 75 MG PO TABS: 75.0000 mg | ORAL_TABLET | Freq: Two times a day (BID) | ORAL | 3 refills | Status: DC

## 2020-11-07 MED FILL — BUPROPION HCL 75 MG TABS: 75 | 30 days supply | Qty: 60 | Fill #0

## 2020-11-07 NOTE — Assessment & Plan Note (Signed)
Pt's PE WNL w/ exception of weight gain.  UTD on pap, immunizations.  Check labs.  Anticipatory guidance provided.  

## 2020-11-07 NOTE — Assessment & Plan Note (Signed)
Deteriorated.  Pt has gained 10 lbs since last visit.  Discussed need for healthy diet and regular exercise.  Check labs to risk stratify.

## 2020-11-07 NOTE — Assessment & Plan Note (Signed)
Deteriorated.  Pt's mood has worsened recently since stopping SSRI.  She reports feeling physically better off the medication but mood is not controlled.  Will switch to Wellbutrin to see if pt tolerates this better.  Encouraged her to resume counseling.  Will follow.

## 2020-11-07 NOTE — Patient Instructions (Signed)
Follow up in 4-6 weeks to recheck mood We'll notify you of your lab results and make any changes if needed Continue to work on healthy diet and regular exercise- you can do it! Start the Wellbutrin twice daily Call with any questions or concerns Stay Safe!  Stay Healthy! Happy New Year!!!

## 2020-11-07 NOTE — Progress Notes (Signed)
   Subjective:    Patient ID: Lindsay Case, female    DOB: 1986-03-16, 35 y.o.   MRN: [id number]  HPI CPE- UTD on pap, Tdap, flu, COVID.  Reviewed past medical, surgical, family and social histories.   Patient Care Team    Relationship Specialty Notifications Start End  Sheliah Hatch, MD PCP - General Family Medicine  08/12/17   Waynard Reeds, MD Consulting Physician Obstetrics and Gynecology  08/12/17   [place], Amy, MD Consulting Physician Dermatology  08/12/17     Health Maintenance  Topic Date Due  . Hepatitis C Screening  11/07/2021 (Originally 1986/06/03)  . HIV Screening  11/07/2021 (Originally 10/06/2000)  . PAP SMEAR-Modifier  07/25/2022  . TETANUS/TDAP  01/11/2027  . INFLUENZA VACCINE  Completed      Review of Systems Patient reports no vision/ hearing changes, adenopathy,fever, persistant/recurrent hoarseness , swallowing issues, chest pain, palpitations, edema, persistant/recurrent cough, hemoptysis, dyspnea (rest/exertional/paroxysmal nocturnal), gastrointestinal bleeding (melena, rectal bleeding), abdominal pain, significant heartburn, bowel changes, GU symptoms (dysuria, hematuria, incontinence), Gyn symptoms (abnormal  bleeding, pain),  syncope, focal weakness, memory loss, numbness & tingling, skin/hair/nail changes, abnormal bruising or bleeding.   + depression- current PHQ9 score 11.  Went on vacation in September and forgot to take meds.  Counselor released her from therapy b/c she was doing better.  Feels she did ok for the first few months off medication but now notes mood is different.  Husband has noticed.  Pt notes that she felt better physically off medication but mood suffered.  + weight gain  This visit occurred during the SARS-CoV-2 public health emergency.  Safety protocols were in place, including screening questions prior to the visit, additional usage of staff PPE, and extensive cleaning of exam room while observing appropriate contact time as  indicated for disinfecting solutions.       Objective:   Physical Exam General Appearance:    Alert, cooperative, no distress, appears stated age  Head:    Normocephalic, without obvious abnormality, atraumatic  Eyes:    PERRL, conjunctiva/corneas clear, EOM's intact, fundi    benign, both eyes  Ears:    Normal TM's and external ear canals, both ears  Nose:   Deferred due to COVID  Throat:   Neck:   Supple, symmetrical, trachea midline, no adenopathy;    Thyroid: no enlargement/tenderness/nodules  Back:     Symmetric, no curvature, ROM normal, no CVA tenderness  Lungs:     Clear to auscultation bilaterally, respirations unlabored  Chest Wall:    No tenderness or deformity   Heart:    Regular rate and rhythm, S1 and S2 normal, no murmur, rub   or gallop  Breast Exam:    Deferred to GYN  Abdomen:     Soft, non-tender, bowel sounds active all four quadrants,    no masses, no organomegaly  Genitalia:    Deferred to GYN  Rectal:    Extremities:   Extremities normal, atraumatic, no cyanosis or edema  Pulses:   2+ and symmetric all extremities  Skin:   Skin color, texture, turgor normal, no rashes or lesions  Lymph nodes:   Cervical, supraclavicular, and axillary nodes normal  Neurologic:   CNII-XII intact, normal strength, sensation and reflexes    throughout          Assessment & Plan:

## 2020-12-06 ENCOUNTER — Encounter: Payer: Self-pay | Admitting: Family Medicine

## 2020-12-06 ENCOUNTER — Other Ambulatory Visit: Payer: Self-pay | Admitting: Family Medicine

## 2020-12-06 ENCOUNTER — Telehealth (INDEPENDENT_AMBULATORY_CARE_PROVIDER_SITE_OTHER): Payer: 59 | Admitting: Family Medicine

## 2020-12-06 DIAGNOSIS — F32A Depression, unspecified: Secondary | ICD-10-CM | POA: Diagnosis not present

## 2020-12-06 DIAGNOSIS — F419 Anxiety disorder, unspecified: Secondary | ICD-10-CM | POA: Diagnosis not present

## 2020-12-06 MED ORDER — BUPROPION HCL ER (XL) 150 MG PO TB24: 150.0000 mg | ORAL_TABLET | Freq: Every day | ORAL | 3 refills | Status: DC

## 2020-12-06 MED FILL — buPROPion HCL ER (XL) 150 M: 150 | 30 days supply | Qty: 30 | Fill #0

## 2020-12-06 NOTE — Progress Notes (Signed)
I connected with  Lindsay Case on 12/06/20 by a video enabled telemedicine application and verified that I am speaking with the correct person using two identifiers.   I discussed the limitations of evaluation and management by telemedicine. The patient expressed understanding and agreed to proceed.

## 2020-12-06 NOTE — Progress Notes (Signed)
   Virtual Visit via Video   I connected with patient on 12/06/20 at  2:00 PM EST by a video enabled telemedicine application and verified that I am speaking with the correct person using two identifiers.  Location patient: Home Location provider: [facility], Office Persons participating in the virtual visit: Patient, Provider, [place] (Sabrina M)  I discussed the limitations of evaluation and management by telemedicine and the availability of in person appointments. The patient expressed understanding and agreed to proceed.  Subjective:   HPI:   Anxiety/Depression- At last visit pt was started on Wellbutrin 75mg  BID.  Husband notices improvement in mood.  Pt reports physically feeling well- no nausea like she had w/ SSRIs.  Pt has lost 6 lbs since last visit- this is reassuring for her.  Overall pt likes the medication and would like to go up on it.  ROS:   See pertinent positives and negatives per HPI.  Patient Active Problem List   Diagnosis Date Noted  . Overweight (BMI 25.0-29.9) 11/07/2020  . Anxiety and depression 04/25/2019  . Physical exam 08/12/2017  . Pre-syncope 08/12/2017  . Cervical intraepithelial neoplasia grade 2 11/03/2012    Social History   Tobacco Use  . Smoking status: Never Smoker  . Smokeless tobacco: Never Used  Substance Use Topics  . Alcohol use: Yes    Alcohol/week: 3.0 standard drinks    Types: 3 Standard drinks or equivalent per week    Current Outpatient Medications:  .  ALPRAZolam (XANAX) 0.5 MG tablet, 1/2-1 tab BID prn panic/flights, Disp: 60 tablet, Rfl: 3 .  buPROPion (WELLBUTRIN) 75 MG tablet, Take 1 tablet (75 mg total) by mouth 2 (two) times daily., Disp: 60 tablet, Rfl: 3  No Known Allergies  Objective:   There were no vitals taken for this visit. AAOx3, NAD NCAT, EOMI No obvious CN deficits Coloring WNL Pt is able to speak clearly, coherently without shortness of breath or increased work of breathing.  Thought  process is linear.  Mood is appropriate.   Assessment and Plan:   Anxiety/Depression- improved since starting Wellbutrin.  Pt emotionally and physically feeling better.  Husband has noticed a change/improvement.  Will increase to the extended release 150mg  and continue to monitor.  Pt expressed understanding and is in agreement w/ plan.    Annye Asa, MD 12/06/2020

## 2021-01-08 MED FILL — buPROPion HCL ER (XL) 150 M: 150 | 30 days supply | Qty: 30 | Fill #1

## 2021-02-11 ENCOUNTER — Other Ambulatory Visit (HOSPITAL_COMMUNITY): Payer: Self-pay

## 2021-02-11 MED FILL — Bupropion HCl Tab ER 24HR 150 MG: ORAL | 30 days supply | Qty: 30 | Fill #0 | Status: AC

## 2021-03-15 ENCOUNTER — Other Ambulatory Visit (HOSPITAL_COMMUNITY): Payer: Self-pay

## 2021-03-15 MED FILL — Bupropion HCl Tab ER 24HR 150 MG: ORAL | 30 days supply | Qty: 30 | Fill #1 | Status: AC

## 2021-04-11 ENCOUNTER — Other Ambulatory Visit: Payer: Self-pay | Admitting: Family Medicine

## 2021-04-11 ENCOUNTER — Other Ambulatory Visit (HOSPITAL_COMMUNITY): Payer: Self-pay

## 2021-04-11 MED ORDER — BUPROPION HCL ER (XL) 150 MG PO TB24
ORAL_TABLET | Freq: Every day | ORAL | 1 refills | Status: DC
Start: 2021-04-11 — End: 2021-06-20
  Filled 2021-04-11: qty 30, 30d supply, fill #0
  Filled 2021-05-14: qty 30, 30d supply, fill #1

## 2021-04-23 ENCOUNTER — Other Ambulatory Visit (HOSPITAL_COMMUNITY): Payer: Self-pay

## 2021-04-23 MED FILL — Norethindrone Ace & Ethinyl Estradiol-FE Tab 1.5 MG-30 MCG: ORAL | 84 days supply | Qty: 112 | Fill #0 | Status: AC

## 2021-05-01 ENCOUNTER — Encounter: Payer: Self-pay | Admitting: *Deleted

## 2021-05-07 DIAGNOSIS — F341 Dysthymic disorder: Secondary | ICD-10-CM | POA: Diagnosis not present

## 2021-05-14 ENCOUNTER — Other Ambulatory Visit (HOSPITAL_COMMUNITY): Payer: Self-pay

## 2021-06-03 ENCOUNTER — Telehealth: Payer: Self-pay | Admitting: Family Medicine

## 2021-06-03 DIAGNOSIS — F341 Dysthymic disorder: Secondary | ICD-10-CM | POA: Diagnosis not present

## 2021-06-03 NOTE — Telephone Encounter (Signed)
..  Type of form received:  Physical  Additional comments:   Received by:  Tye Savoy should be Faxed to:  Form should be mailed to:    Is patient requesting call for pickup:Yes - Please call patient at [phone]   Form placed:  In Dr. Virgil Benedict bin  Attach charge sheet.  Provider will determine charge.  Individual made aware of 3-5 business day turn around (Y/N)?

## 2021-06-05 NOTE — Telephone Encounter (Signed)
Forms placed in bin to be filled for patient in pcp absence.

## 2021-06-20 ENCOUNTER — Other Ambulatory Visit: Payer: Self-pay | Admitting: Family Medicine

## 2021-06-20 ENCOUNTER — Other Ambulatory Visit (HOSPITAL_COMMUNITY): Payer: Self-pay

## 2021-06-20 MED ORDER — BUPROPION HCL ER (XL) 150 MG PO TB24
150.0000 mg | ORAL_TABLET | Freq: Every day | ORAL | 1 refills | Status: DC
  Filled 2021-06-20: qty 30, 30d supply, fill #0
  Filled 2021-07-30: qty 30, 30d supply, fill #1

## 2021-07-29 DIAGNOSIS — H52222 Regular astigmatism, left eye: Secondary | ICD-10-CM | POA: Diagnosis not present

## 2021-07-30 ENCOUNTER — Other Ambulatory Visit (HOSPITAL_COMMUNITY): Payer: Self-pay

## 2021-08-14 ENCOUNTER — Other Ambulatory Visit: Payer: Self-pay

## 2021-08-15 ENCOUNTER — Other Ambulatory Visit (HOSPITAL_COMMUNITY): Payer: Self-pay

## 2021-08-15 MED ORDER — NORETHIN ACE-ETH ESTRAD-FE 1.5-30 MG-MCG PO TABS
ORAL_TABLET | ORAL | 0 refills | Status: DC
  Filled 2021-08-15: qty 28, 21d supply, fill #0

## 2021-08-26 ENCOUNTER — Other Ambulatory Visit (HOSPITAL_COMMUNITY): Payer: Self-pay

## 2021-08-26 ENCOUNTER — Other Ambulatory Visit: Payer: Self-pay | Admitting: Family Medicine

## 2021-08-26 MED ORDER — BUPROPION HCL ER (XL) 150 MG PO TB24
150.0000 mg | ORAL_TABLET | Freq: Every day | ORAL | 1 refills | Status: DC
  Filled 2021-08-26: qty 30, 30d supply, fill #0
  Filled 2021-09-30: qty 30, 30d supply, fill #1

## 2021-08-27 ENCOUNTER — Other Ambulatory Visit (HOSPITAL_COMMUNITY): Payer: Self-pay

## 2021-08-27 DIAGNOSIS — Z01419 Encounter for gynecological examination (general) (routine) without abnormal findings: Secondary | ICD-10-CM | POA: Diagnosis not present

## 2021-08-27 MED ORDER — NORETHIN ACE-ETH ESTRAD-FE 1.5-30 MG-MCG PO TABS
ORAL_TABLET | ORAL | 4 refills | Status: DC
  Filled 2021-08-27 – 2021-09-10 (×2): qty 112, 84d supply, fill #0

## 2021-09-02 ENCOUNTER — Other Ambulatory Visit (HOSPITAL_COMMUNITY): Payer: Self-pay

## 2021-09-10 ENCOUNTER — Other Ambulatory Visit (HOSPITAL_COMMUNITY): Payer: Self-pay

## 2021-09-30 ENCOUNTER — Other Ambulatory Visit (HOSPITAL_COMMUNITY): Payer: Self-pay

## 2021-11-01 ENCOUNTER — Other Ambulatory Visit: Payer: Self-pay | Admitting: Family Medicine

## 2021-11-01 ENCOUNTER — Other Ambulatory Visit (HOSPITAL_COMMUNITY): Payer: Self-pay

## 2021-11-01 MED ORDER — BUPROPION HCL ER (XL) 150 MG PO TB24
150.0000 mg | ORAL_TABLET | Freq: Every day | ORAL | 1 refills | Status: DC
  Filled 2021-11-01: qty 30, 30d supply, fill #0
  Filled 2021-12-05: qty 30, 30d supply, fill #1

## 2021-11-06 ENCOUNTER — Other Ambulatory Visit (HOSPITAL_COMMUNITY): Payer: Self-pay

## 2021-11-08 ENCOUNTER — Encounter: Payer: Self-pay | Admitting: Family Medicine

## 2021-11-08 ENCOUNTER — Ambulatory Visit (INDEPENDENT_AMBULATORY_CARE_PROVIDER_SITE_OTHER): Payer: 59 | Admitting: Family Medicine

## 2021-11-08 VITALS — BP 136/84 | HR 82 | Temp 97.3°F | Resp 16 | Ht 64.0 in | Wt 147.2 lb

## 2021-11-08 DIAGNOSIS — Z0000 Encounter for general adult medical examination without abnormal findings: Secondary | ICD-10-CM

## 2021-11-08 DIAGNOSIS — E663 Overweight: Secondary | ICD-10-CM | POA: Diagnosis not present

## 2021-11-08 LAB — HEPATIC FUNCTION PANEL
ALT: 49 U/L — ABNORMAL HIGH (ref 0–35)
AST: 121 U/L — ABNORMAL HIGH (ref 0–37)
Albumin: 4.4 g/dL (ref 3.5–5.2)
Alkaline Phosphatase: 46 U/L (ref 39–117)
Bilirubin, Direct: 0.1 mg/dL (ref 0.0–0.3)
Total Bilirubin: 0.4 mg/dL (ref 0.2–1.2)
Total Protein: 6.5 g/dL (ref 6.0–8.3)

## 2021-11-08 LAB — VITAMIN D 25 HYDROXY (VIT D DEFICIENCY, FRACTURES): VITD: 26.46 ng/mL — ABNORMAL LOW (ref 30.00–100.00)

## 2021-11-08 LAB — BASIC METABOLIC PANEL
BUN: 13 mg/dL (ref 6–23)
CO2: 25 mEq/L (ref 19–32)
Calcium: 9.2 mg/dL (ref 8.4–10.5)
Chloride: 105 mEq/L (ref 96–112)
Creatinine, Ser: 0.9 mg/dL (ref 0.40–1.20)
GFR: 82.49 mL/min (ref >60.00)
Glucose, Bld: 78 mg/dL (ref 70–99)
Potassium: 4.2 mEq/L (ref 3.5–5.1)
Sodium: 138 mEq/L (ref 135–145)

## 2021-11-08 LAB — CBC WITH DIFFERENTIAL/PLATELET
Basophils Absolute: 0 10*3/uL (ref 0.0–0.1)
Basophils Relative: 0.3 % (ref 0.0–3.0)
Eosinophils Absolute: 0.1 10*3/uL (ref 0.0–0.7)
Eosinophils Relative: 2.3 % (ref 0.0–5.0)
HCT: 43.1 % (ref 36.0–46.0)
Hemoglobin: 14.1 g/dL (ref 12.0–15.0)
Lymphocytes Relative: 34.7 % (ref 12.0–46.0)
Lymphs Abs: 1.5 10*3/uL (ref 0.7–4.0)
MCHC: 32.7 g/dL (ref 30.0–36.0)
MCV: 92.1 fl (ref 78.0–100.0)
Monocytes Absolute: 0.5 10*3/uL (ref 0.1–1.0)
Monocytes Relative: 11 % (ref 3.0–12.0)
Neutro Abs: 2.3 10*3/uL (ref 1.4–7.7)
Neutrophils Relative %: 51.7 % (ref 43.0–77.0)
Platelets: 247 10*3/uL (ref 150.0–400.0)
RBC: 4.68 Mil/uL (ref 3.87–5.11)
RDW: 12.9 % (ref 11.5–15.5)
WBC: 4.4 10*3/uL (ref 4.0–10.5)

## 2021-11-08 LAB — LIPID PANEL
Cholesterol: 169 mg/dL (ref 0–200)
HDL: 60.2 mg/dL (ref >39.00)
LDL Cholesterol: 95 mg/dL (ref 0–99)
NonHDL: 108.82
Total CHOL/HDL Ratio: 3
Triglycerides: 68 mg/dL (ref 0.0–149.0)
VLDL: 13.6 mg/dL (ref 0.0–40.0)

## 2021-11-08 LAB — TSH: TSH: 2.07 u[IU]/mL (ref 0.35–5.50)

## 2021-11-08 NOTE — Patient Instructions (Signed)
Follow up in 1 year or as needed We'll notify you of your lab results and make any changes if needed Keep up the good work on healthy diet and regular exercise- you look GREAT!!! Call with any questions or concerns Stay Safe!  Stay Healthy!! [place]! Enjoy the [place]!!

## 2021-11-08 NOTE — Assessment & Plan Note (Signed)
Pt is down 8 lbs since last visit.  Applauded her efforts at healthy diet and regular exercise and encouraged her to continue.

## 2021-11-08 NOTE — Progress Notes (Signed)
° °  Subjective:    Patient ID: Lindsay Case, female    DOB: 1986/07/12, 36 y.o.   MRN: [id number]  HPI CPE- UTD on pap, Tdap, flu.  No concerns today  Patient Care Team    Relationship Specialty Notifications Start End  Midge Minium, MD PCP - General Family Medicine  08/12/17   Vanessa Kick, MD Consulting Physician Obstetrics and Gynecology  08/12/17   [place], Amy, MD Consulting Physician Dermatology  08/12/17     Health Maintenance  Topic Date Due   HIV Screening  Never done   Hepatitis C Screening  Never done   COVID-19 Vaccine (4 - Booster for Pfizer series) 09/28/2020   PAP SMEAR-Modifier  07/25/2022   TETANUS/TDAP  01/11/2027   INFLUENZA VACCINE  Completed   Pneumococcal Vaccine 65-105 Years old  Aged Out   HPV VACCINES  Aged Out      Review of Systems Patient reports no vision/ hearing changes, adenopathy,fever, weight change,  persistant/recurrent hoarseness , swallowing issues, chest pain, palpitations, edema, persistant/recurrent cough, hemoptysis, dyspnea (rest/exertional/paroxysmal nocturnal), gastrointestinal bleeding (melena, rectal bleeding), abdominal pain, significant heartburn, bowel changes, GU symptoms (dysuria, hematuria, incontinence), Gyn symptoms (abnormal  bleeding, pain),  syncope, focal weakness, memory loss, numbness & tingling, skin/hair/nail changes, abnormal bruising or bleeding, anxiety, or depression.   This visit occurred during the SARS-CoV-2 public health emergency.  Safety protocols were in place, including screening questions prior to the visit, additional usage of staff PPE, and extensive cleaning of exam room while observing appropriate contact time as indicated for disinfecting solutions.      Objective:   Physical Exam General Appearance:    Alert, cooperative, no distress, appears stated age  Head:    Normocephalic, without obvious abnormality, atraumatic  Eyes:    PERRL, conjunctiva/corneas clear, EOM's intact, fundi    benign,  both eyes  Ears:    Normal TM's and external ear canals, both ears  Nose:   Deferred due to COVID  Throat:   Neck:   Supple, symmetrical, trachea midline, no adenopathy;    Thyroid: no enlargement/tenderness/nodules  Back:     Symmetric, no curvature, ROM normal, no CVA tenderness  Lungs:     Clear to auscultation bilaterally, respirations unlabored  Chest Wall:    No tenderness or deformity   Heart:    Regular rate and rhythm, S1 and S2 normal, no murmur, rub   or gallop  Breast Exam:    Deferred to GYN  Abdomen:     Soft, non-tender, bowel sounds active all four quadrants,    no masses, no organomegaly  Genitalia:    Deferred to GYN  Rectal:    Extremities:   Extremities normal, atraumatic, no cyanosis or edema  Pulses:   2+ and symmetric all extremities  Skin:   Skin color, texture, turgor normal, no rashes or lesions  Lymph nodes:   Cervical, supraclavicular, and axillary nodes normal  Neurologic:   CNII-XII intact, normal strength, sensation and reflexes    throughout          Assessment & Plan:

## 2021-11-08 NOTE — Assessment & Plan Note (Signed)
Pt's PE WNL.  UTD on pap, immunizations.  Check labs.  Anticipatory guidance provided.

## 2021-11-11 ENCOUNTER — Other Ambulatory Visit (HOSPITAL_COMMUNITY): Payer: Self-pay

## 2021-11-11 ENCOUNTER — Encounter: Payer: Self-pay | Admitting: Family Medicine

## 2021-11-11 ENCOUNTER — Other Ambulatory Visit: Payer: Self-pay

## 2021-11-11 ENCOUNTER — Telehealth: Payer: Self-pay

## 2021-11-11 DIAGNOSIS — R7989 Other specified abnormal findings of blood chemistry: Secondary | ICD-10-CM

## 2021-11-11 MED ORDER — VITAMIN D (ERGOCALCIFEROL) 1.25 MG (50000 UNIT) PO CAPS
50000.0000 [IU] | ORAL_CAPSULE | ORAL | 0 refills | Status: DC
  Filled 2021-11-11: qty 12, 84d supply, fill #0

## 2021-11-11 NOTE — Telephone Encounter (Signed)
-----   Message from Midge Minium, MD sent at 11/11/2021 10:52 AM EST ----- Labs look good w/ 2 exceptions-  1) Your Vit D level is low.  Based on this, we need to start prescription 50,000 units weekly x12 weeks in addition to daily OTC supplement of at least 2000 units.   2) Your liver enzymes are elevated.  This is typically due to increased use of tylenol or increased alcohol consumption.  Please hold both x2 weeks and we'll repeat your liver functions at a lab only visit

## 2021-11-11 NOTE — Telephone Encounter (Signed)
Patient is aware of labs, sent in Vitamin D and set up a lab appt for two weeks to recheck liver function

## 2021-11-26 ENCOUNTER — Other Ambulatory Visit (INDEPENDENT_AMBULATORY_CARE_PROVIDER_SITE_OTHER): Payer: 59

## 2021-11-26 DIAGNOSIS — R7989 Other specified abnormal findings of blood chemistry: Secondary | ICD-10-CM

## 2021-11-27 LAB — HEPATIC FUNCTION PANEL
ALT: 29 U/L (ref 0–35)
AST: 33 U/L (ref 0–37)
Albumin: 4.7 g/dL (ref 3.5–5.2)
Alkaline Phosphatase: 53 U/L (ref 39–117)
Bilirubin, Direct: 0.1 mg/dL (ref 0.0–0.3)
Total Bilirubin: 0.4 mg/dL (ref 0.2–1.2)
Total Protein: 6.8 g/dL (ref 6.0–8.3)

## 2021-12-05 ENCOUNTER — Other Ambulatory Visit (HOSPITAL_COMMUNITY): Payer: Self-pay

## 2022-01-13 ENCOUNTER — Other Ambulatory Visit: Payer: Self-pay | Admitting: Family Medicine

## 2022-01-14 ENCOUNTER — Other Ambulatory Visit (HOSPITAL_COMMUNITY): Payer: Self-pay

## 2022-01-14 MED ORDER — BUPROPION HCL ER (XL) 150 MG PO TB24
150.0000 mg | ORAL_TABLET | Freq: Every day | ORAL | 1 refills | Status: DC
  Filled 2022-01-14: qty 30, 30d supply, fill #0
  Filled 2022-02-20: qty 30, 30d supply, fill #1

## 2022-02-20 ENCOUNTER — Other Ambulatory Visit (HOSPITAL_COMMUNITY): Payer: Self-pay

## 2022-03-04 ENCOUNTER — Encounter: Payer: Self-pay | Admitting: Family Medicine

## 2022-03-05 ENCOUNTER — Encounter: Payer: Self-pay | Admitting: Registered Nurse

## 2022-03-05 ENCOUNTER — Ambulatory Visit: Payer: 59 | Admitting: Registered Nurse

## 2022-03-05 VITALS — HR 81 | Temp 98.6°F | Resp 18 | Ht 64.0 in | Wt 140.6 lb

## 2022-03-05 DIAGNOSIS — R350 Frequency of micturition: Secondary | ICD-10-CM | POA: Diagnosis not present

## 2022-03-05 LAB — POCT URINALYSIS DIP (MANUAL ENTRY)
Bilirubin, UA: NEGATIVE
Glucose, UA: NEGATIVE mg/dL
Ketones, POC UA: NEGATIVE mg/dL
Nitrite, UA: NEGATIVE
Spec Grav, UA: >=1.03 — AB (ref 1.010–1.025)
Urobilinogen, UA: 0.2 E.U./dL
pH, UA: 6 (ref 5.0–8.0)

## 2022-03-05 MED ORDER — SULFAMETHOXAZOLE-TRIMETHOPRIM 800-160 MG PO TABS: 1.0000 | ORAL_TABLET | Freq: Two times a day (BID) | ORAL | 0 refills | Status: DC

## 2022-03-05 NOTE — Addendum Note (Signed)
Addended by: Maximiano Coss on: 03/05/2022 03:05 PM ? ? Modules accepted: Orders ? ?

## 2022-03-05 NOTE — Patient Instructions (Addendum)
Lindsay Case -  ? ?Great to meet you ? ?Bactrim twice daily x 3 days ? ?Call if worsening or failing to improve ? ?I'll let you know if urine culture shows any concerns. ? ?Can use phenazopyridium (AZO brand name) for pain relief. ? ?Try to stay well hydrated with 2-3 liters of water daily. ? ?Thank you ? ?Rich  ? ? ? ?If you have lab work done today you will be contacted with your lab results within the next 2 weeks.  If you have not heard from [place] then please contact us. The fastest way to get your results is to register for My Chart. ? ? ?IF you received an x-ray today, you will receive an invoice from [facility]. Please contact [facility] at [phone] with questions or concerns regarding your invoice.  ? ?IF you received labwork today, you will receive an invoice from [facility]. Please contact [facility] at [phone] with questions or concerns regarding your invoice.  ? ?Our billing staff will not be able to assist you with questions regarding bills from these companies. ? ?You will be contacted with the lab results as soon as they are available. The fastest way to get your results is to activate your My Chart account. Instructions are located on the last page of this paperwork. If you have not heard from [place] regarding the results in 2 weeks, please contact this office. ?  ? ? ?

## 2022-03-05 NOTE — Progress Notes (Signed)
? ?Acute Office Visit ? ?Subjective:  ? ? Patient ID: Lindsay Case, female    DOB: 20-Sep-1986, 36 y.o.   MRN: [id number] ? ?Chief Complaint  ?Patient presents with  ? Urinary Frequency  ?  Patient states she has been experiencing some urine frequency and discomfort.  ? ? ?HPI ?Patient is in today for urinary frequency. ? ?Onset within past 1-2 days.  ?Sensation of urine leaking ?Dysuria last night, sense of urgency without voiding. ?Has had some lower back pain.  ? ?No fevers, chills, nvd. ? ?Outpatient Medications Prior to Visit  ?Medication Sig Dispense Refill  ? buPROPion (WELLBUTRIN XL) 150 MG 24 hr tablet Take 1 tablet (150 mg total) by mouth daily. 30 tablet 1  ? norethindrone-ethinyl estradiol-iron (LARIN FE 1.5/30) 1.5-30 MG-MCG tablet TAKE 1 TABLET BY MOUTH DAILY SKIPPING PLACEBO 84 tablet 4  ? Vitamin D, Ergocalciferol, (DRISDOL) 1.25 MG (50000 UNIT) CAPS capsule Take 1 capsule (50,000 Units total) by mouth every 7 (seven) days. 12 capsule 0  ? norethindrone-ethinyl estradiol-iron (LOESTRIN FE) 1.5-30 MG-MCG tablet TAKE 1 TABLET BY MOUTH DAILY SKIPPING PLACEBO 84 tablet 5  ? ?No facility-administered medications prior to visit.  ? ? ?Review of Systems  ?Constitutional: Negative.   ?HENT: Negative.    ?Eyes: Negative.   ?Respiratory: Negative.    ?Cardiovascular: Negative.   ?Gastrointestinal: Negative.   ?Genitourinary: Negative.   ?Musculoskeletal: Negative.   ?Skin: Negative.   ?Neurological: Negative.   ?Psychiatric/Behavioral: Negative.    ?All other systems reviewed and are negative. ? ?   ?Objective:  ?  ?Pulse 81   Temp 98.6 ?F (37 ?C) (Temporal)   Resp 18   Ht '5\' 4"'$  (1.626 m)   Wt 140 lb 9.6 oz (63.8 kg)   SpO2 98%   BMI 24.13 kg/m?  ?Physical Exam ?Vitals and nursing note reviewed.  ?Constitutional:   ?   General: She is not in acute distress. ?   Appearance: Normal appearance. She is normal weight. She is not ill-appearing, toxic-appearing or diaphoretic.  ?Cardiovascular:  ?   Rate and  Rhythm: Normal rate and regular rhythm.  ?   Heart sounds: Normal heart sounds. No murmur heard. ?  No friction rub. No gallop.  ?Pulmonary:  ?   Effort: Pulmonary effort is normal. No respiratory distress.  ?   Breath sounds: Normal breath sounds. No stridor. No wheezing, rhonchi or rales.  ?Chest:  ?   Chest wall: No tenderness.  ?Skin: ?   General: Skin is warm and dry.  ?Neurological:  ?   General: No focal deficit present.  ?   Mental Status: She is alert and oriented to person, place, and time. Mental status is at baseline.  ?Psychiatric:     ?   Mood and Affect: Mood normal.     ?   Behavior: Behavior normal.     ?   Thought Content: Thought content normal.     ?   Judgment: Judgment normal.  ? ? ?Results for orders placed or performed in visit on 03/05/22  ?POCT urinalysis dipstick  ?Result Value Ref Range  ? Color, UA yellow yellow  ? Clarity, UA clear clear  ? Glucose, UA negative negative mg/dL  ? Bilirubin, UA negative negative  ? Ketones, POC UA negative negative mg/dL  ? Spec Grav, UA >=1.030 (A) 1.010 - 1.025  ? Blood, UA moderate (A) negative  ? pH, UA 6.0 5.0 - 8.0  ? Protein Ur, POC trace (A) negative  mg/dL  ? Urobilinogen, UA 0.2 0.2 or 1.0 E.U./dL  ? Nitrite, UA Negative Negative  ? Leukocytes, UA Small (1+) (A) Negative  ? ? ? ?   ?Assessment & Plan:  ?1. Frequent urination ?- POCT urinalysis dipstick ?- sulfamethoxazole-trimethoprim (BACTRIM DS) 800-160 MG tablet; Take 1 tablet by mouth 2 (two) times daily.  Dispense: 6 tablet; Refill: 0 ? ? ? ?Meds ordered this encounter  ?Medications  ? sulfamethoxazole-trimethoprim (BACTRIM DS) 800-160 MG tablet  ?  Sig: Take 1 tablet by mouth 2 (two) times daily.  ?  Dispense:  6 tablet  ?  Refill:  0  ?  Order Specific Question:   Supervising Provider  ?  Answer:   Carlota Raspberry, JEFFREY R [[id number]]  ? ? ?Return if symptoms worsen or fail to improve. ? ?PLAN ?Suspect UTI based on symptoms and poct ua results. ?Bactrim po bid x 3 days ?Culture sent, follow up as  indicated ?Patient encouraged to call clinic with any questions, comments, or concerns. ? ?Maximiano Coss, NP ?

## 2022-03-07 LAB — URINE CULTURE
MICRO NUMBER:: 13347543
SPECIMEN QUALITY:: ADEQUATE

## 2022-04-01 ENCOUNTER — Other Ambulatory Visit: Payer: Self-pay | Admitting: Family Medicine

## 2022-04-01 ENCOUNTER — Other Ambulatory Visit (HOSPITAL_COMMUNITY): Payer: Self-pay

## 2022-04-02 ENCOUNTER — Other Ambulatory Visit (HOSPITAL_COMMUNITY): Payer: Self-pay

## 2022-04-02 MED ORDER — BUPROPION HCL ER (XL) 150 MG PO TB24
150.0000 mg | ORAL_TABLET | Freq: Every day | ORAL | 1 refills | Status: DC
  Filled 2022-04-02: qty 30, 30d supply, fill #0
  Filled 2022-05-12: qty 30, 30d supply, fill #1

## 2022-04-15 ENCOUNTER — Telehealth: Payer: 59 | Admitting: Physician Assistant

## 2022-04-15 ENCOUNTER — Telehealth: Payer: Self-pay

## 2022-04-15 DIAGNOSIS — U071 COVID-19: Secondary | ICD-10-CM

## 2022-04-15 MED ORDER — NIRMATRELVIR/RITONAVIR (PAXLOVID)TABLET: 3.0000 | ORAL_TABLET | Freq: Two times a day (BID) | ORAL | 0 refills | Status: AC

## 2022-04-15 MED ORDER — BENZONATATE 100 MG PO CAPS: 100.0000 mg | ORAL_CAPSULE | Freq: Three times a day (TID) | ORAL | 0 refills | Status: DC | PRN

## 2022-04-15 NOTE — Telephone Encounter (Signed)
Called and spoke with pt, advise her if she starts to feel worst symptoms to F/U with PCP or UC. Patient had an E visit today for COVID. Medication prescribed. Pt will continue questionnaire to monitor symptoms.

## 2022-04-15 NOTE — Telephone Encounter (Signed)
Saw Lindsay Case for [employer]

## 2022-04-15 NOTE — Telephone Encounter (Signed)
Pt had Evisit and was given Paxlovid

## 2022-04-15 NOTE — Progress Notes (Signed)
Virtual Visit Consent   [name], you are scheduled for a virtual visit with a [facility] provider today. Just as with appointments in the office, your consent must be obtained to participate. Your consent will be active for this visit and any virtual visit you may have with one of our providers in the next 365 days. If you have a MyChart account, a copy of this consent can be sent to you electronically.  As this is a virtual visit, video technology does not allow for your provider to perform a traditional examination. This may limit your provider's ability to fully assess your condition. If your provider identifies any concerns that need to be evaluated in person or the need to arrange testing (such as labs, EKG, etc.), we will make arrangements to do so. Although advances in technology are sophisticated, we cannot ensure that it will always work on either your end or our end. If the connection with a video visit is poor, the visit may have to be switched to a telephone visit. With either a video or telephone visit, we are not always able to ensure that we have a secure connection.  By engaging in this virtual visit, you consent to the provision of healthcare and authorize for your insurance to be billed (if applicable) for the services provided during this visit. Depending on your insurance coverage, you may receive a charge related to this service.  I need to obtain your verbal consent now. Are you willing to proceed with your visit today? Lindsay Case has provided verbal consent on 04/15/2022 for a virtual visit (video or telephone). [name], [place]  Date: 04/15/2022 11:05 AM  Virtual Visit via Video Note   I, [name], connected with  Lindsay Case  ([id number], [birth year]) on 04/15/22 at 11:00 AM EDT by a video-enabled telemedicine application and verified that I am speaking with the correct person using two identifiers.  Location: Patient: Virtual Visit  Location Patient: Home Provider: Virtual Visit Location Provider: Home Office   I discussed the limitations of evaluation and management by telemedicine and the availability of in person appointments. The patient expressed understanding and agreed to proceed.    History of Present Illness: Lindsay Case is a 36 y.o. who identifies as a female who was assigned female at birth, and is being seen today for COVID-19. Notes symptoms starting Sunday evening with sore throat and body aches. Now with temperature (Tmax 102.4) down to 99.8 with her OTC fever-reducing medications. Notes chest congestion. Denies chest pain or SOB. Denies lightheadedness. Notes body aches and fatigue are severe. Has not had COVID before that she is aware. Tested positive last night.   HPI: HPI  Problems:  Patient Active Problem List   Diagnosis Date Noted   Overweight (BMI 25.0-29.9) 11/07/2020   Anxiety and depression 04/25/2019   Physical exam 08/12/2017   Pre-syncope 08/12/2017   Cervical intraepithelial neoplasia grade 2 11/03/2012    Allergies: No Known Allergies Medications:  Current Outpatient Medications:    benzonatate (TESSALON) 100 MG capsule, Take 1 capsule (100 mg total) by mouth 3 (three) times daily as needed for cough., Disp: 30 capsule, Rfl: 0   nirmatrelvir/ritonavir EUA (PAXLOVID) 20 x 150 MG & 10 x '100MG'$  TABS, Take 3 tablets by mouth 2 (two) times daily for 5 days. (Take nirmatrelvir 150 mg two tablets twice daily for 5 days and ritonavir 100 mg one tablet twice daily for 5 days) Patient GFR is 82.49,  Disp: 30 tablet, Rfl: 0   buPROPion (WELLBUTRIN XL) 150 MG 24 hr tablet, Take 1 tablet (150 mg total) by mouth daily., Disp: 30 tablet, Rfl: 1   Vitamin D, Ergocalciferol, (DRISDOL) 1.25 MG (50000 UNIT) CAPS capsule, Take 1 capsule (50,000 Units total) by mouth every 7 (seven) days., Disp: 12 capsule, Rfl: 0  Observations/Objective: Patient is well-developed, well-nourished in no acute distress.   Resting comfortably at home.  Head is normocephalic, atraumatic.  No labored breathing. Speech is clear and coherent with logical content.  Patient is alert and oriented at baseline.   Assessment and Plan: 1. COVID-19 - MyChart COVID-19 home monitoring program; Future - nirmatrelvir/ritonavir EUA (PAXLOVID) 20 x 150 MG & 10 x '100MG'$  TABS; Take 3 tablets by mouth 2 (two) times daily for 5 days. (Take nirmatrelvir 150 mg two tablets twice daily for 5 days and ritonavir 100 mg one tablet twice daily for 5 days) Patient GFR is 82.49  Dispense: 30 tablet; Refill: 0 - benzonatate (TESSALON) 100 MG capsule; Take 1 capsule (100 mg total) by mouth 3 (three) times daily as needed for cough.  Dispense: 30 capsule; Refill: 0  Patient without multiple risk factors for complicated course of illness but more severe symptoms at present. Discussed risks/benefits of antiviral medications including most common potential ADRs. Patient voiced understanding and would like to proceed with antiviral medication. No concerns of current pregnancy or plan for future pregnancy in the next several months. They are candidate for Paxlovid with recent GFR on file. Rx sent to pharmacy. Supportive measures, OTC medications and vitamin regimen reviewed. Tessalon per orders. Patient has been enrolled in a MyChart COVID symptom monitoring program. Samule Dry reviewed in detail. Strict ER precautions discussed with patient.   Follow Up Instructions: I discussed the assessment and treatment plan with the patient. The patient was provided an opportunity to ask questions and all were answered. The patient agreed with the plan and demonstrated an understanding of the instructions.  A copy of instructions were sent to the patient via MyChart unless otherwise noted below.   The patient was advised to call back or seek an in-person evaluation if the symptoms worsen or if the condition fails to improve as anticipated.  Time:  I spent 10  minutes with the patient via telehealth technology discussing the above problems/concerns.    [name], PA-C

## 2022-04-15 NOTE — Telephone Encounter (Signed)
Patient is calling because she has tested positive for Covid. Patient has been put on the schedule for a video visit but states she can not wait until tomorrow for any medication with the pain and 102 fever. Told patient that I would send a message back.

## 2022-04-15 NOTE — Patient Instructions (Signed)
Lindsay Case, thank you for joining [name], PA-C for today's virtual visit.  While this provider is not your primary care provider (PCP), if your PCP is located in our provider database this encounter information will be shared with them immediately following your visit.  Consent: (Patient) Lindsay Case provided verbal consent for this virtual visit at the beginning of the encounter.  Current Medications:  Current Outpatient Medications:    buPROPion (WELLBUTRIN XL) 150 MG 24 hr tablet, Take 1 tablet (150 mg total) by mouth daily., Disp: 30 tablet, Rfl: 1   norethindrone-ethinyl estradiol-iron (LARIN FE 1.5/30) 1.5-30 MG-MCG tablet, TAKE 1 TABLET BY MOUTH DAILY SKIPPING PLACEBO, Disp: 84 tablet, Rfl: 4   norethindrone-ethinyl estradiol-iron (LOESTRIN FE) 1.5-30 MG-MCG tablet, TAKE 1 TABLET BY MOUTH DAILY SKIPPING PLACEBO, Disp: 84 tablet, Rfl: 5   sulfamethoxazole-trimethoprim (BACTRIM DS) 800-160 MG tablet, Take 1 tablet by mouth 2 (two) times daily., Disp: 6 tablet, Rfl: 0   Vitamin D, Ergocalciferol, (DRISDOL) 1.25 MG (50000 UNIT) CAPS capsule, Take 1 capsule (50,000 Units total) by mouth every 7 (seven) days., Disp: 12 capsule, Rfl: 0   Medications ordered in this encounter:  No orders of the defined types were placed in this encounter.    *If you need refills on other medications prior to your next appointment, please contact your pharmacy*  Follow-Up: Call back or seek an in-person evaluation if the symptoms worsen or if the condition fails to improve as anticipated.  Other Instructions Please keep well-hydrated and get plenty of rest. Start a saline nasal rinse to flush out your nasal passages. You can use plain Mucinex to help thin congestion. If you have a humidifier, running in the bedroom at night. I want you to start OTC vitamin D3 1000 units daily, vitamin C 1000 mg daily, and a zinc supplement. Please take prescribed medications as directed.  You have  been enrolled in a MyChart symptom monitoring program. Please answer these questions daily so we can keep track of how you are doing.  You were to quarantine for 5 days from onset of your symptoms.  After day 5, if you have had no fever and you are feeling better, you can end quarantine but need to mask for an additional 5 days. After day 5 if you have a fever or are having significant symptoms, please quarantine for full 10 days.  If you note any worsening of symptoms, any significant shortness of breath or any chest pain, please seek ER evaluation ASAP.  Please do not delay care!  COVID-19: What to Do if You Are Sick If you test positive and are an older adult or someone who is at high risk of getting very sick from COVID-19, treatment may be available. Contact a healthcare provider right away after a positive test to determine if you are eligible, even if your symptoms are mild right now. You can also visit a Test to Treat location and, if eligible, receive a prescription from a provider. Don't delay: Treatment must be started within the first few days to be effective. If you have a fever, cough, or other symptoms, you might have COVID-19. Most people have mild illness and are able to recover at home. If you are sick: Keep track of your symptoms. If you have an emergency warning sign (including trouble breathing), call 911. Steps to help prevent the spread of COVID-19 if you are sick If you are sick with COVID-19 or think you might have COVID-19, follow the  steps below to care for yourself and to help protect other people in your home and community. Stay home except to get medical care Stay home. Most people with COVID-19 have mild illness and can recover at home without medical care. Do not leave your home, except to get medical care. Do not visit public areas and do not go to places where you are unable to wear a mask. Take care of yourself. Get rest and stay hydrated. Take over-the-counter  medicines, such as acetaminophen, to help you feel better. Stay in touch with your doctor. Call before you get medical care. Be sure to get care if you have trouble breathing, or have any other emergency warning signs, or if you think it is an emergency. Avoid public transportation, ride-sharing, or taxis if possible. Get tested If you have symptoms of COVID-19, get tested. While waiting for test results, stay away from others, including staying apart from those living in your household. Get tested as soon as possible after your symptoms start. Treatments may be available for people with COVID-19 who are at risk for becoming very sick. Don't delay: Treatment must be started early to be effective--some treatments must begin within 5 days of your first symptoms. Contact your healthcare provider right away if your test result is positive to determine if you are eligible. Self-tests are one of several options for testing for the virus that causes COVID-19 and may be more convenient than laboratory-based tests and point-of-care tests. Ask your healthcare provider or your local health department if you need help interpreting your test results. You can visit your state, tribal, local, and territorial health department's website to look for the latest local information on testing sites. Separate yourself from other people As much as possible, stay in a specific room and away from other people and pets in your home. If possible, you should use a separate bathroom. If you need to be around other people or animals in or outside of the home, wear a well-fitting mask. Tell your close contacts that they may have been exposed to COVID-19. An infected person can spread COVID-19 starting 48 hours (or 2 days) before the person has any symptoms or tests positive. By letting your close contacts know they may have been exposed to COVID-19, you are helping to protect everyone. See COVID-19 and Animals if you have questions  about pets. If you are diagnosed with COVID-19, someone from the health department may call you. Answer the call to slow the spread. Monitor your symptoms Symptoms of COVID-19 include fever, cough, or other symptoms. Follow care instructions from your healthcare provider and local health department. Your local health authorities may give instructions on checking your symptoms and reporting information. When to seek emergency medical attention Look for emergency warning signs* for COVID-19. If someone is showing any of these signs, seek emergency medical care immediately: Trouble breathing Persistent pain or pressure in the chest New confusion Inability to wake or stay awake Pale, gray, or blue-colored skin, lips, or nail beds, depending on skin tone *This list is not all possible symptoms. Please call your medical provider for any other symptoms that are severe or concerning to you. Call 911 or call ahead to your local emergency facility: Notify the operator that you are seeking care for someone who has or may have COVID-19. Call ahead before visiting your doctor Call ahead. Many medical visits for routine care are being postponed or done by phone or telemedicine. If you have a medical appointment that  cannot be postponed, call your doctor's office, and tell them you have or may have COVID-19. This will help the office protect themselves and other patients. If you are sick, wear a well-fitting mask You should wear a mask if you must be around other people or animals, including pets (even at home). Wear a mask with the best fit, protection, and comfort for you. You don't need to wear the mask if you are alone. If you can't put on a mask (because of trouble breathing, for example), cover your coughs and sneezes in some other way. Try to stay at least 6 feet away from other people. This will help protect the people around you. Masks should not be placed on young children under age 32 years, anyone  who has trouble breathing, or anyone who is not able to remove the mask without help. Cover your coughs and sneezes Cover your mouth and nose with a tissue when you cough or sneeze. Throw away used tissues in a lined trash can. Immediately wash your hands with soap and water for at least 20 seconds. If soap and water are not available, clean your hands with an alcohol-based hand sanitizer that contains at least 60% alcohol. Clean your hands often Wash your hands often with soap and water for at least 20 seconds. This is especially important after blowing your nose, coughing, or sneezing; going to the bathroom; and before eating or preparing food. Use hand sanitizer if soap and water are not available. Use an alcohol-based hand sanitizer with at least 60% alcohol, covering all surfaces of your hands and rubbing them together until they feel dry. Soap and water are the best option, especially if hands are visibly dirty. Avoid touching your eyes, nose, and mouth with unwashed hands. Handwashing Tips Avoid sharing personal household items Do not share dishes, drinking glasses, cups, eating utensils, towels, or bedding with other people in your home. Wash these items thoroughly after using them with soap and water or put in the dishwasher. Clean surfaces in your home regularly Clean and disinfect high-touch surfaces (for example, doorknobs, tables, handles, light switches, and countertops) in your "sick room" and bathroom. In shared spaces, you should clean and disinfect surfaces and items after each use by the person who is ill. If you are sick and cannot clean, a caregiver or other person should only clean and disinfect the area around you (such as your bedroom and bathroom) on an as needed basis. Your caregiver/other person should wait as long as possible (at least several hours) and wear a mask before entering, cleaning, and disinfecting shared spaces that you use. Clean and disinfect areas that may  have blood, stool, or body fluids on them. Use household cleaners and disinfectants. Clean visible dirty surfaces with household cleaners containing soap or detergent. Then, use a household disinfectant. Use a product from [employer] List N: Disinfectants for Coronavirus (ZHGDJ-24). Be sure to follow the instructions on the label to ensure safe and effective use of the product. Many products recommend keeping the surface wet with a disinfectant for a certain period of time (look at "contact time" on the product label). You may also need to wear personal protective equipment, such as gloves, depending on the directions on the product label. Immediately after disinfecting, wash your hands with soap and water for 20 seconds. For completed guidance on cleaning and disinfecting your home, visit Complete Disinfection Guidance. Take steps to improve ventilation at home Improve ventilation (air flow) at home to help prevent  from spreading COVID-19 to other people in your household. Clear out COVID-19 virus particles in the air by opening windows, using air filters, and turning on fans in your home. Use this interactive tool to learn how to improve air flow in your home. When you can be around others after being sick with COVID-19 Deciding when you can be around others is different for different situations. Find out when you can safely end home isolation. For any additional questions about your care, contact your healthcare provider or state or local health department. 01/22/2021 Content source: [facility] ([facility]), Division of Viral Diseases This information is not intended to replace advice given to you by your health care provider. Make sure you discuss any questions you have with your health care provider. Document Revised: 03/07/2021 Document Reviewed: 03/07/2021 Elsevier Patient Education  2022 [employer].      If you have been instructed to have an  in-person evaluation today at a local Urgent Care facility, please use the link below. It will take you to a list of all of our available [facility], including address, phone number and hours of operation. Please do not delay care.  [facility]  If you or a family member do not have a primary care provider, use the link below to schedule a visit and establish care. When you choose a [facility] primary care physician or advanced practice provider, you gain a long-term partner in health. Find a Primary Care Provider  Learn more about [facility] in-office and virtual care options: [facility]

## 2022-04-16 ENCOUNTER — Telehealth: Payer: Self-pay

## 2022-04-16 ENCOUNTER — Telehealth: Payer: 59 | Admitting: Family Medicine

## 2022-04-16 NOTE — Telephone Encounter (Signed)
Called and spoke with pt about COVID questionnaire, she indicated a change in diarrhea.Advise pt from my chart companion in regards to worsening diarrhea. Pt verb understanding.

## 2022-05-12 ENCOUNTER — Other Ambulatory Visit (HOSPITAL_COMMUNITY): Payer: Self-pay

## 2022-06-18 ENCOUNTER — Other Ambulatory Visit: Payer: Self-pay | Admitting: Family Medicine

## 2022-06-19 ENCOUNTER — Other Ambulatory Visit (HOSPITAL_COMMUNITY): Payer: Self-pay

## 2022-06-19 MED ORDER — BUPROPION HCL ER (XL) 150 MG PO TB24
150.0000 mg | ORAL_TABLET | Freq: Every day | ORAL | 1 refills | Status: DC
  Filled 2022-06-19: qty 30, 30d supply, fill #0
  Filled 2022-07-28: qty 30, 30d supply, fill #1

## 2022-07-28 ENCOUNTER — Other Ambulatory Visit (HOSPITAL_COMMUNITY): Payer: Self-pay

## 2022-08-18 DIAGNOSIS — L281 Prurigo nodularis: Secondary | ICD-10-CM | POA: Diagnosis not present

## 2022-08-22 ENCOUNTER — Other Ambulatory Visit (HOSPITAL_COMMUNITY): Payer: Self-pay

## 2022-08-22 ENCOUNTER — Other Ambulatory Visit: Payer: Self-pay | Admitting: Family Medicine

## 2022-08-22 MED ORDER — BUPROPION HCL ER (XL) 150 MG PO TB24
150.0000 mg | ORAL_TABLET | Freq: Every day | ORAL | 1 refills | Status: DC
  Filled 2022-08-22: qty 30, 30d supply, fill #0
  Filled 2022-09-23: qty 30, 30d supply, fill #1

## 2022-09-10 DIAGNOSIS — Z01419 Encounter for gynecological examination (general) (routine) without abnormal findings: Secondary | ICD-10-CM | POA: Diagnosis not present

## 2022-09-10 DIAGNOSIS — Z124 Encounter for screening for malignant neoplasm of cervix: Secondary | ICD-10-CM | POA: Diagnosis not present

## 2022-09-10 LAB — HM PAP SMEAR

## 2022-09-12 ENCOUNTER — Encounter: Payer: Self-pay | Admitting: Family Medicine

## 2022-09-23 ENCOUNTER — Other Ambulatory Visit (HOSPITAL_COMMUNITY): Payer: Self-pay

## 2022-10-06 ENCOUNTER — Ambulatory Visit (HOSPITAL_COMMUNITY)
Admission: RE | Admit: 2022-10-06 | Discharge: 2022-10-06 | Disposition: A | Discharge status: Home / Self Care | Payer: 59 | Source: Ambulatory Visit | Attending: Family Medicine | Admitting: Family Medicine

## 2022-10-06 ENCOUNTER — Ambulatory Visit: Payer: 59 | Admitting: Family Medicine

## 2022-10-06 ENCOUNTER — Encounter: Payer: Self-pay | Admitting: Family Medicine

## 2022-10-06 ENCOUNTER — Other Ambulatory Visit (HOSPITAL_COMMUNITY): Payer: Self-pay

## 2022-10-06 VITALS — BP 116/70 | HR 92 | Temp 98.1°F | Wt 139.6 lb

## 2022-10-06 DIAGNOSIS — R200 Anesthesia of skin: Secondary | ICD-10-CM

## 2022-10-06 DIAGNOSIS — G4485 Primary stabbing headache: Secondary | ICD-10-CM

## 2022-10-06 DIAGNOSIS — M542 Cervicalgia: Secondary | ICD-10-CM | POA: Diagnosis not present

## 2022-10-06 DIAGNOSIS — R519 Headache, unspecified: Secondary | ICD-10-CM

## 2022-10-06 MED ORDER — PREDNISONE 10 MG PO TABS
ORAL_TABLET | ORAL | 0 refills | Status: AC
  Filled 2022-10-06: qty 18, 9d supply, fill #0

## 2022-10-06 NOTE — Patient Instructions (Signed)
Follow up as needed or as scheduled They'll contact you to schedule your CT scan and xrays START the Prednisone as directed- take w/ food Try ice or heat (whichever feels better) on the neck Call with any questions or concerns Stay Safe!  Stay Healthy! Hang in there! Happy Birthday!!!

## 2022-10-06 NOTE — Progress Notes (Signed)
Subjective:    Patient ID: Lindsay Case, female    DOB: 24-Sep-1986, 36 y.o.   MRN: [id number]  HPI HA- sxs started 1-2 weeks ago.  Starts suddenly as sharp pain in R occiput and radiates through head towards eye.  Sxs only last a few seconds and resolve spontaneously.  No HA's in between episodes.  Episodes were occurring ~4x/hr on Thursday.  Yesterday occurred 5x all day.  Has already had an episode this morning.  No blurry vision.  No changes in medication, supplements, exercise, or stress levels recently.  Back of head is not TTP.  Not triggered by light or sound.  No pattern that pt can determine.  Has not woken her from sleep.  Pt has chronic back/neck pain.  Numbness- pt will wake w/ numbness and tingling of fingers and thumb bilaterally.  Also notes that fingers will go numb w/ prolonged standing in surgery.     Review of Systems For ROS see HPI     Objective:   Physical Exam Vitals reviewed.  Constitutional:      General: She is not in acute distress.    Appearance: She is well-developed. She is not ill-appearing.  HENT:     Head: Normocephalic and atraumatic.  Eyes:     Extraocular Movements: Extraocular movements intact.     Right eye: Normal extraocular motion and no nystagmus.     Left eye: Normal extraocular motion and no nystagmus.     Pupils: Pupils are equal, round, and reactive to light.  Pulmonary:     Effort: Pulmonary effort is normal. No respiratory distress.  Musculoskeletal:     Cervical back: Normal range of motion and neck supple. No rigidity.  Lymphadenopathy:     Cervical: No cervical adenopathy.  Skin:    General: Skin is warm and dry.  Neurological:     Mental Status: She is alert and oriented to person, place, and time.     Cranial Nerves: No cranial nerve deficit, dysarthria or facial asymmetry.     Coordination: Coordination normal.     Gait: Gait normal.     Deep Tendon Reflexes: Reflexes normal.  Psychiatric:        Mood and Affect: Mood  normal. Mood is not anxious.        Speech: Speech normal.        Behavior: Behavior normal. Behavior is not agitated.           Assessment & Plan:  Stabbing occipital pain/headache- new.  Pt reports sxs only last a few seconds but are occurring multiple times a day.  New as of 1-2 weeks ago.  Denies visual changes, HA's in the time between episodes, sensitivity to light or sound, dizziness, nausea.  No pattern that pt can find.  Sxs have not woken her from sleep.  She has a hx of chronic back and neck pain.  Suspect this is some sort of neuralgia- possibly occipital neuralgia but could be due to her chronic neck pain.  Will get Cspine xrays and start Prednisone taper.  Will also get noncontrast head CT to evaluate.  Pt expressed understanding and is in agreement w/ plan.   Bilateral hand numbness- new to provider, ongoing for pt.  She thinks this is due to her chronic back and neck pain.  Will get C spine xrays to assess and also start Prednisone taper.  If no improvement will need Neuro eval.  Pt expressed understanding and is in agreement w/ plan.

## 2022-10-14 ENCOUNTER — Ambulatory Visit (HOSPITAL_COMMUNITY): Payer: 59

## 2022-11-12 ENCOUNTER — Encounter: Payer: 59 | Admitting: Family Medicine

## 2022-11-12 ENCOUNTER — Ambulatory Visit (INDEPENDENT_AMBULATORY_CARE_PROVIDER_SITE_OTHER): Payer: 59 | Admitting: Family Medicine

## 2022-11-12 ENCOUNTER — Other Ambulatory Visit: Payer: Self-pay

## 2022-11-12 ENCOUNTER — Other Ambulatory Visit (HOSPITAL_COMMUNITY): Payer: Self-pay

## 2022-11-12 ENCOUNTER — Encounter: Payer: Self-pay | Admitting: Family Medicine

## 2022-11-12 ENCOUNTER — Telehealth: Payer: Self-pay

## 2022-11-12 VITALS — BP 120/72 | HR 72 | Temp 98.4°F | Resp 16 | Ht 64.0 in | Wt 139.0 lb

## 2022-11-12 DIAGNOSIS — E559 Vitamin D deficiency, unspecified: Secondary | ICD-10-CM

## 2022-11-12 DIAGNOSIS — Z0000 Encounter for general adult medical examination without abnormal findings: Secondary | ICD-10-CM | POA: Diagnosis not present

## 2022-11-12 DIAGNOSIS — R634 Abnormal weight loss: Secondary | ICD-10-CM | POA: Diagnosis not present

## 2022-11-12 DIAGNOSIS — F419 Anxiety disorder, unspecified: Secondary | ICD-10-CM

## 2022-11-12 LAB — HEPATIC FUNCTION PANEL
ALT: 16 U/L (ref 0–35)
AST: 16 U/L (ref 0–37)
Albumin: 4.5 g/dL (ref 3.5–5.2)
Alkaline Phosphatase: 45 U/L (ref 39–117)
Bilirubin, Direct: 0.1 mg/dL (ref 0.0–0.3)
Total Bilirubin: 0.5 mg/dL (ref 0.2–1.2)
Total Protein: 6.8 g/dL (ref 6.0–8.3)

## 2022-11-12 LAB — CBC WITH DIFFERENTIAL/PLATELET
Basophils Absolute: 0 10*3/uL (ref 0.0–0.1)
Basophils Relative: 0.4 % (ref 0.0–3.0)
Eosinophils Absolute: 0.1 10*3/uL (ref 0.0–0.7)
Eosinophils Relative: 1.9 % (ref 0.0–5.0)
HCT: 42.4 % (ref 36.0–46.0)
Hemoglobin: 14.3 g/dL (ref 12.0–15.0)
Lymphocytes Relative: 36.6 % (ref 12.0–46.0)
Lymphs Abs: 1.9 10*3/uL (ref 0.7–4.0)
MCHC: 33.7 g/dL (ref 30.0–36.0)
MCV: 90.4 fl (ref 78.0–100.0)
Monocytes Absolute: 0.6 10*3/uL (ref 0.1–1.0)
Monocytes Relative: 11.7 % (ref 3.0–12.0)
Neutro Abs: 2.5 10*3/uL (ref 1.4–7.7)
Neutrophils Relative %: 49.4 % (ref 43.0–77.0)
Platelets: 270 10*3/uL (ref 150.0–400.0)
RBC: 4.69 Mil/uL (ref 3.87–5.11)
RDW: 12.7 % (ref 11.5–15.5)
WBC: 5.1 10*3/uL (ref 4.0–10.5)

## 2022-11-12 LAB — LIPID PANEL
Cholesterol: 177 mg/dL (ref 0–200)
HDL: 71.8 mg/dL (ref >39.00)
LDL Cholesterol: 94 mg/dL (ref 0–99)
NonHDL: 105.16
Total CHOL/HDL Ratio: 2
Triglycerides: 54 mg/dL (ref 0.0–149.0)
VLDL: 10.8 mg/dL (ref 0.0–40.0)

## 2022-11-12 LAB — BASIC METABOLIC PANEL
BUN: 17 mg/dL (ref 6–23)
CO2: 24 mEq/L (ref 19–32)
Calcium: 9.3 mg/dL (ref 8.4–10.5)
Chloride: 106 mEq/L (ref 96–112)
Creatinine, Ser: 0.96 mg/dL (ref 0.40–1.20)
GFR: 75.8 mL/min (ref >60.00)
Glucose, Bld: 85 mg/dL (ref 70–99)
Potassium: 4.6 mEq/L (ref 3.5–5.1)
Sodium: 141 mEq/L (ref 135–145)

## 2022-11-12 LAB — VITAMIN D 25 HYDROXY (VIT D DEFICIENCY, FRACTURES): VITD: 25.76 ng/mL — ABNORMAL LOW (ref 30.00–100.00)

## 2022-11-12 LAB — TSH: TSH: 2.68 u[IU]/mL (ref 0.35–5.50)

## 2022-11-12 MED ORDER — BUPROPION HCL ER (XL) 150 MG PO TB24
150.0000 mg | ORAL_TABLET | Freq: Every day | ORAL | 5 refills | Status: DC
  Filled 2022-11-12: qty 30, 30d supply, fill #0
  Filled 2022-12-22: qty 30, 30d supply, fill #1
  Filled 2023-03-17: qty 30, 30d supply, fill #2
  Filled 2023-04-13: qty 30, 30d supply, fill #3
  Filled 2023-05-12: qty 30, 30d supply, fill #4
  Filled 2023-06-15: qty 30, 30d supply, fill #5

## 2022-11-12 MED ORDER — VITAMIN D (ERGOCALCIFEROL) 1.25 MG (50000 UNIT) PO CAPS
50000.0000 [IU] | ORAL_CAPSULE | ORAL | 12 refills | Status: DC
  Filled 2022-11-12: qty 7, 49d supply, fill #0

## 2022-11-12 NOTE — Progress Notes (Signed)
   Subjective:    Patient ID: Lindsay Case, female    DOB: February 22, 1986, 37 y.o.   MRN: [id number]  HPI CPE- UTD on pap, Tdap, flu  Patient Care Team    Relationship Specialty Notifications Start End  Midge Minium, MD PCP - General Family Medicine  08/12/17   Vanessa Kick, MD Consulting Physician Obstetrics and Gynecology  08/12/17   Martin[place], Amy, MD Consulting Physician Dermatology  08/12/17     Health Maintenance  Topic Date Due   COVID-19 Vaccine (4 - 2023-24 season) 07/04/2022   Hepatitis C Screening  10/07/2023 (Originally 10/07/2003)   HIV Screening  10/07/2023 (Originally 10/06/2000)   PAP SMEAR-Modifier  09/10/2025   DTaP/Tdap/Td (2 - Td or Tdap) 01/11/2027   INFLUENZA VACCINE  Completed   HPV VACCINES  Aged Out      Review of Systems Patient reports no vision/ hearing changes, adenopathy,fever, persistant/recurrent hoarseness , swallowing issues, chest pain, palpitations, edema, persistant/recurrent cough, hemoptysis, dyspnea (rest/exertional/paroxysmal nocturnal), gastrointestinal bleeding (melena, rectal bleeding), abdominal pain, significant heartburn, bowel changes, GU symptoms (dysuria, hematuria, incontinence), Gyn symptoms (abnormal  bleeding, pain),  syncope, focal weakness, memory loss, numbness & tingling, skin/hair/nail changes, abnormal bruising or bleeding, anxiety, or depression.   + 8 lb weight loss- unintentional    Objective:   Physical Exam General Appearance:    Alert, cooperative, no distress, appears stated age  Head:    Normocephalic, without obvious abnormality, atraumatic  Eyes:    PERRL, conjunctiva/corneas clear, EOM's intact both eyes  Ears:    Normal TM's and external ear canals, both ears  Nose:   Nares normal, septum midline, mucosa normal, no drainage    or sinus tenderness  Throat:   Lips, mucosa, and tongue normal; teeth and gums normal  Neck:   Supple, symmetrical, trachea midline, no adenopathy;    Thyroid: no  enlargement/tenderness/nodules  Back:     Symmetric, no curvature, ROM normal, no CVA tenderness  Lungs:     Clear to auscultation bilaterally, respirations unlabored  Chest Wall:    No tenderness or deformity   Heart:    Regular rate and rhythm, S1 and S2 normal, no murmur, rub   or gallop  Breast Exam:    Deferred to GYN  Abdomen:     Soft, non-tender, bowel sounds active all four quadrants,    no masses, no organomegaly  Genitalia:    Deferred to GYN  Rectal:    Extremities:   Extremities normal, atraumatic, no cyanosis or edema  Pulses:   2+ and symmetric all extremities  Skin:   Skin color, texture, turgor normal, no rashes or lesions  Lymph nodes:   Cervical, supraclavicular, and axillary nodes normal  Neurologic:   CNII-XII intact, normal strength, sensation and reflexes    throughout          Assessment & Plan:

## 2022-11-12 NOTE — Telephone Encounter (Signed)
Informed pt of lab results . Sent in Vit d 50,000 units to pharmacy

## 2022-11-12 NOTE — Assessment & Plan Note (Signed)
Check labs and replete prn. 

## 2022-11-12 NOTE — Assessment & Plan Note (Signed)
Pt's PE WNL.  Pt is down 8 lbs w/o making any changes.  Will check labs.  UTD on pap, immunizations.  Anticipatory guidance provided.

## 2022-11-12 NOTE — Telephone Encounter (Signed)
-----   Message from Midge Minium, MD sent at 11/12/2022  3:54 PM EST ----- Labs look great w/ exception of low Vit D.  Based on this, we need to start 50,000 units weekly x12 weeks in addition to daily OTC supplement of at least 2000 units.

## 2022-11-12 NOTE — Patient Instructions (Signed)
Follow up in 1 year or as needed We'll notify you of your lab results and make any changes if needed Keep up the good work!  You look great!! Call with any questions or concerns Stay Safe!  Stay Healthy! Happy New Year!!! ENJOY YOUR TRIP!!!

## 2022-11-18 DIAGNOSIS — L858 Other specified epidermal thickening: Secondary | ICD-10-CM | POA: Diagnosis not present

## 2022-11-18 DIAGNOSIS — D225 Melanocytic nevi of trunk: Secondary | ICD-10-CM | POA: Diagnosis not present

## 2022-11-18 DIAGNOSIS — D2261 Melanocytic nevi of right upper limb, including shoulder: Secondary | ICD-10-CM | POA: Diagnosis not present

## 2022-11-18 DIAGNOSIS — D2272 Melanocytic nevi of left lower limb, including hip: Secondary | ICD-10-CM | POA: Diagnosis not present

## 2022-11-18 DIAGNOSIS — D2271 Melanocytic nevi of right lower limb, including hip: Secondary | ICD-10-CM | POA: Diagnosis not present

## 2022-11-18 DIAGNOSIS — L638 Other alopecia areata: Secondary | ICD-10-CM | POA: Diagnosis not present

## 2022-11-18 DIAGNOSIS — D2221 Melanocytic nevi of right ear and external auricular canal: Secondary | ICD-10-CM | POA: Diagnosis not present

## 2022-11-18 DIAGNOSIS — D2262 Melanocytic nevi of left upper limb, including shoulder: Secondary | ICD-10-CM | POA: Diagnosis not present

## 2022-12-16 DIAGNOSIS — D485 Neoplasm of uncertain behavior of skin: Secondary | ICD-10-CM | POA: Diagnosis not present

## 2022-12-16 DIAGNOSIS — D225 Melanocytic nevi of trunk: Secondary | ICD-10-CM | POA: Diagnosis not present

## 2023-02-04 ENCOUNTER — Ambulatory Visit: Payer: 59 | Admitting: Family Medicine

## 2023-02-04 ENCOUNTER — Other Ambulatory Visit (HOSPITAL_COMMUNITY): Payer: Self-pay

## 2023-02-04 ENCOUNTER — Encounter: Payer: Self-pay | Admitting: Family Medicine

## 2023-02-04 VITALS — BP 116/78 | HR 74 | Temp 98.7°F | Resp 16 | Ht 64.0 in | Wt 140.5 lb

## 2023-02-04 DIAGNOSIS — R002 Palpitations: Secondary | ICD-10-CM

## 2023-02-04 MED ORDER — METOPROLOL TARTRATE 25 MG PO TABS
25.0000 mg | ORAL_TABLET | ORAL | 3 refills | Status: DC | PRN
  Filled 2023-02-04: qty 30, 30d supply, fill #0

## 2023-02-04 NOTE — Progress Notes (Signed)
   Subjective:    Patient ID: Lindsay Case, female    DOB: Jan 01, 1986, 37 y.o.   MRN: [id number]  HPI Palpitations- sxs started while skiing where she felt really restricted in helmet and hood.  Started having a panic attack- removed any clothing that felt constrictive.  After 10 minutes everything returned to normal.  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  Was able to talk herself down and breathe through the episode.  1 week later was walking the dog and she had increased mouth watering-'like I was going to vomit'.  Noticed heart was racing.  'Did vagal breathing' and things returned to normal.  Sunday night sxs actually awoke her from sleep.  Mom had hx of SVT.  Pt reports no current anxiety or increased stress levels.  No change in caffeine intake recently.     Review of Systems For ROS see HPI     Objective:   Physical Exam Vitals reviewed.  Constitutional:      General: She is not in acute distress.    Appearance: Normal appearance. She is well-developed.  HENT:     Head: Normocephalic and atraumatic.  Eyes:     Conjunctiva/sclera: Conjunctivae normal.     Pupils: Pupils are equal, round, and reactive to light.  Neck:     Thyroid: No thyromegaly.  Cardiovascular:     Rate and Rhythm: Normal rate and regular rhythm.     Pulses: Normal pulses.     Heart sounds: Normal heart sounds. No murmur heard. Pulmonary:     Effort: Pulmonary effort is normal. No respiratory distress.     Breath sounds: Normal breath sounds.  Abdominal:     General: There is no distension.     Palpations: Abdomen is soft.     Tenderness: There is no abdominal tenderness.  Musculoskeletal:     Cervical back: Normal range of motion and neck supple.  Lymphadenopathy:     Cervical: No cervical adenopathy.  Skin:    General: Skin is warm and dry.  Neurological:     General: No focal deficit present.     Mental Status: She  is alert and oriented to person, place, and time.  Psychiatric:        Mood and Affect: Mood normal.        Behavior: Behavior normal.        Thought Content: Thought content normal.           Assessment & Plan:   Palpitations- new.  Based on her description, it doesn't sound like these are anxiety attacks (w/ exception of possibly the one on the ski [place]).  Given that she had an episode in a relaxed moment at work, on a walk w/ her dog, and one while sleeping it sounds more consistent w/ SVT or other abnormal rhythm.  EKG WNL today.  Will get labs to assess thyroid, blood count, and electrolytes.  Refer to Cards for complete evaluation.  Encouraged her to limit caffeine and prescribed Metoprolol to take as needed.  Pt expressed understanding and is in agreement w/ plan.

## 2023-02-04 NOTE — Patient Instructions (Signed)
Follow up as needed or as scheduled We'll notify you of your lab results and make any changes if needed We'll call you to schedule your Cardiology appt Try and limit caffeine Take the Metoprolol as needed if you become symptomatic Call with any questions or concerns Hang in there!!!

## 2023-02-05 LAB — CBC WITH DIFFERENTIAL/PLATELET
Basophils Absolute: 0 10*3/uL (ref 0.0–0.1)
Basophils Relative: 0.8 % (ref 0.0–3.0)
Eosinophils Absolute: 0 10*3/uL (ref 0.0–0.7)
Eosinophils Relative: 0.7 % (ref 0.0–5.0)
HCT: 41.9 % (ref 36.0–46.0)
Hemoglobin: 14.2 g/dL (ref 12.0–15.0)
Lymphocytes Relative: 39.3 % (ref 12.0–46.0)
Lymphs Abs: 2.4 10*3/uL (ref 0.7–4.0)
MCHC: 34 g/dL (ref 30.0–36.0)
MCV: 90.6 fl (ref 78.0–100.0)
Monocytes Absolute: 0.7 10*3/uL (ref 0.1–1.0)
Monocytes Relative: 12 % (ref 3.0–12.0)
Neutro Abs: 2.9 10*3/uL (ref 1.4–7.7)
Neutrophils Relative %: 47.2 % (ref 43.0–77.0)
Platelets: 222 10*3/uL (ref 150.0–400.0)
RBC: 4.63 Mil/uL (ref 3.87–5.11)
RDW: 12.5 % (ref 11.5–15.5)
WBC: 6.1 10*3/uL (ref 4.0–10.5)

## 2023-02-05 LAB — BASIC METABOLIC PANEL
BUN: 18 mg/dL (ref 6–23)
CO2: 27 mEq/L (ref 19–32)
Calcium: 9.5 mg/dL (ref 8.4–10.5)
Chloride: 105 mEq/L (ref 96–112)
Creatinine, Ser: 0.96 mg/dL (ref 0.40–1.20)
GFR: 75.68 mL/min (ref >60.00)
Glucose, Bld: 85 mg/dL (ref 70–99)
Potassium: 4.5 mEq/L (ref 3.5–5.1)
Sodium: 138 mEq/L (ref 135–145)

## 2023-02-05 LAB — TSH: TSH: 2.42 u[IU]/mL (ref 0.35–5.50)

## 2023-02-06 ENCOUNTER — Telehealth: Payer: Self-pay

## 2023-02-06 ENCOUNTER — Other Ambulatory Visit (HOSPITAL_COMMUNITY): Payer: Self-pay

## 2023-02-06 NOTE — Telephone Encounter (Signed)
Informed pt of lab results  

## 2023-02-06 NOTE — Telephone Encounter (Signed)
-----   Message from Sheliah Hatch, MD sent at 02/06/2023  7:23 AM EDT ----- Labs look great!  No changes at this time

## 2023-03-17 ENCOUNTER — Other Ambulatory Visit (HOSPITAL_COMMUNITY): Payer: Self-pay

## 2023-03-19 ENCOUNTER — Ambulatory Visit: Payer: 59 | Attending: Interventional Cardiology | Admitting: Interventional Cardiology

## 2023-03-19 ENCOUNTER — Encounter: Payer: Self-pay | Admitting: Interventional Cardiology

## 2023-03-19 VITALS — BP 120/86 | HR 70 | Ht 64.0 in | Wt 142.8 lb

## 2023-03-19 DIAGNOSIS — R002 Palpitations: Secondary | ICD-10-CM | POA: Diagnosis not present

## 2023-03-19 NOTE — Patient Instructions (Signed)
Medication Instructions:  Your physician recommends that you continue on your current medications as directed. Please refer to the Current Medication list given to you today.  *If you need a refill on your cardiac medications before your next appointment, please call your pharmacy*   Lab Work: none If you have labs (blood work) drawn today and your tests are completely normal, you will receive your results only by: MyChart Message (if you have MyChart) OR A paper copy in the mail If you have any lab test that is abnormal or we need to change your treatment, we will call you to review the results.   Testing/Procedures: none   Follow-Up: At [facility], you and your health needs are our priority.  As part of our continuing mission to provide you with exceptional heart care, we have created designated Provider Care Teams.  These Care Teams include your primary Cardiologist (physician) and Advanced Practice Providers (APPs -  Physician Assistants and Nurse Practitioners) who all work together to provide you with the care you need, when you need it.  We recommend signing up for the patient portal called "MyChart".  Sign up information is provided on this After Visit Summary.  MyChart is used to connect with patients for Virtual Visits (Telemedicine).  Patients are able to view lab/test results, encounter notes, upcoming appointments, etc.  Non-urgent messages can be sent to your provider as well.   To learn more about what you can do with MyChart, go to [email or web address].    Your next appointment:   As needed  Provider:   Lance Muss, MD     Other Instructions Let  us know if palpitations return

## 2023-03-19 NOTE — Progress Notes (Signed)
Cardiology Office Note   Date:  03/19/2023   ID:  Lindsay Case, DOB [birth year], MRN [id number]  PCP:  Sheliah Hatch, MD    No chief complaint on file.  palpitations  Wt Readings from Last 3 Encounters:  03/19/23 142 lb 12.8 oz (64.8 kg)  02/04/23 140 lb 8 oz (63.7 kg)  11/12/22 139 lb (63 kg)       History of Present Illness: Lindsay Case is a 37 y.o. female who is being seen today for the evaluation of palpitations at the request of Tabori, Helane Rima, MD.   Records from primary care show: "Palpitations- new. Based on her description, it doesn't sound like these are anxiety attacks (w/ exception of possibly the one [place] the [place]). Given that she had an episode in a relaxed moment at work, on a walk w/ her dog, and one while sleeping it sounds more consistent w/ SVT or other abnormal rhythm. EKG WNL today. Will get labs to assess thyroid, blood count, and electrolytes. Refer to Cards for complete evaluation. Encouraged her to limit caffeine and prescribed Metoprolol to take as needed. " Three episodes in March with heart racing.  First episode after stopping while skiing.  Second episode during surgery. Lasted 10 minutes and felt lightheaded, anxious.  Third episode was while walking a dog.  She felt nauseated. She tried a vagal maneuver and felt better.  Mother has SVT and was supposed to have an ablation.    Has not checked pulse rate, but it was very fast.   1-2 coffees/day.    Denies : Chest pain. Dizziness. Leg edema. Nitroglycerin use. Orthopnea. Paroxysmal nocturnal dyspnea. Shortness of breath. Syncope.    Past Medical History:  Diagnosis Date   History of chicken pox     Past Surgical History:  Procedure Laterality Date   LEEP     MOHS SURGERY     WISDOM TOOTH EXTRACTION       Current Outpatient Medications  Medication Sig Dispense Refill   buPROPion (WELLBUTRIN XL) 150 MG 24 hr tablet Take 1 tablet (150 mg total) by mouth daily.  30 tablet 5   metoprolol tartrate (LOPRESSOR) 25 MG tablet Take as needed for palpitations 30 tablet 3   Vitamin D, Ergocalciferol, (DRISDOL) 1.25 MG (50000 UNIT) CAPS capsule Take 1 capsule (50,000 Units total) by mouth every 7 (seven) days. 7 capsule 12   No current facility-administered medications for this visit.    Allergies:   Patient has no known allergies.    Social History:  The patient  reports that she has never smoked. She has never used smokeless tobacco. She reports current alcohol use of about 3.0 standard drinks of alcohol per week. She reports that she does not use drugs.   Family History:  The patient's family history includes Alcohol abuse in her maternal grandfather; Arthritis in her maternal grandmother; Cancer in her maternal grandfather and paternal grandmother; Early death in her maternal grandfather; Healthy in her mother and sister; Heart attack in her maternal grandfather and paternal grandfather; Hyperlipidemia in her father, paternal grandfather, and paternal grandmother; Hypertension in her father, paternal grandfather, and paternal grandmother; Miscarriages / [place] in her maternal grandmother; Stroke in her paternal grandfather and paternal grandmother.    ROS:  Please see the history of present illness.   Otherwise, review of systems are positive for palpitations.   All other systems are reviewed and negative.    PHYSICAL EXAM: VS:  BP 120/86  Pulse 70   Ht 5\' 4"  (1.626 m)   Wt 142 lb 12.8 oz (64.8 kg)   LMP 02/17/2023   SpO2 99%   BMI 24.51 kg/m  , BMI Body mass index is 24.51 kg/m. GEN: Well nourished, well developed, in no acute distress HEENT: normal Neck: no JVD, carotid bruits, or masses Cardiac: RRR; 1/6 early systolic murmur, no rubs, or gallops,no edema  Respiratory:  clear to auscultation bilaterally, normal work of breathing GI: soft, nontender, nondistended, + BS MS: no deformity or atrophy Skin: warm and dry, no rash Neuro:   Strength and sensation are intact Psych: euthymic mood, full affect   EKG:   The ekg ordered today demonstrates normal ECG   Recent Labs: 11/12/2022: ALT 16 02/04/2023: BUN 18; Creatinine, Ser 0.96; Hemoglobin 14.2; Platelets 222.0; Potassium 4.5; Sodium 138; TSH 2.42   Lipid Panel    Component Value Date/Time   CHOL 177 11/12/2022 0811   TRIG 54.0 11/12/2022 0811   HDL 71.80 11/12/2022 0811   CHOLHDL 2 11/12/2022 0811   VLDL 10.8 11/12/2022 0811   LDLCALC 94 11/12/2022 0811     Other studies Reviewed: Additional studies/ records that were reviewed today with results demonstrating: Labs reviewed.  Normal TSH in April 2024.  LDL 94, HDL 71 in January 0454.   ASSESSMENT AND PLAN:  Palpitations: Episodes that she describes to sound like an episode of SVT.  No high risk features.  No syncope.  No CHF on exam.  No recent symptoms.  I think rhythm monitoring at this time would be low yield.  She will continue to monitor for symptoms.  If at work, she can try to get a rhythm strip.  If she has more symptoms, she will let us know and we can plan for 2 weeks Zio patch.     Current medicines are reviewed at length with the patient today.  The patient concerns regarding her medicines were addressed.  The following changes have been made:  No change  Labs/ tests ordered today include:  No orders of the defined types were placed in this encounter.   Recommend 150 minutes/week of aerobic exercise Low fat, low carb, high fiber diet recommended  Disposition:   FU as needed   Signed, Lance Muss, MD  03/19/2023 4:08 PM    [facility] [address] [place], [place], [place]  [address] Phone: [phone]; Fax: [phone]

## 2023-07-14 DIAGNOSIS — H5203 Hypermetropia, bilateral: Secondary | ICD-10-CM | POA: Diagnosis not present

## 2023-07-23 ENCOUNTER — Other Ambulatory Visit (HOSPITAL_COMMUNITY): Payer: Self-pay

## 2023-07-23 ENCOUNTER — Other Ambulatory Visit: Payer: Self-pay | Admitting: Family Medicine

## 2023-07-23 DIAGNOSIS — F419 Anxiety disorder, unspecified: Secondary | ICD-10-CM

## 2023-07-23 MED ORDER — BUPROPION HCL ER (XL) 150 MG PO TB24
150.0000 mg | ORAL_TABLET | Freq: Every day | ORAL | 5 refills | Status: DC
Start: 2023-07-23 — End: 2023-11-18
  Filled 2023-07-23: qty 30, 30d supply, fill #0
  Filled 2023-08-27: qty 30, 30d supply, fill #1

## 2023-08-21 ENCOUNTER — Ambulatory Visit: Payer: 59 | Admitting: Family Medicine

## 2023-08-26 ENCOUNTER — Telehealth: Payer: Self-pay | Admitting: Family Medicine

## 2023-08-26 NOTE — Telephone Encounter (Signed)
[facility] [place] Night - C Client Site  [facility] [place] - Night Provider Lezlie Octave- MD Contact Type Call Who Is Calling Patient / Member / Family / Caregiver Caller Name Telecia Hina Caller Phone Number [phone] Patient Name Lindsay Case Patient DOB [birth year] Call Type Message Only Information Provided Reason for Call Request to [facility] Appointment Initial Comment Caller has an appt for tomorrow at 9am but she's needing to cancel and she will callback later to reschedule. Patient request to speak to RN No Additional Comment Office hours Mon - Fri 800am - 500pm Disp. Time Disposition Final User 08/20/2023 7:20:04 AM General Information Provided Yes Tiburcio Pea, Lanette Call Closed By: Evette Doffing Transaction Date/Time: 08/20/2023 7:18:15 AM (ET)

## 2023-10-05 DIAGNOSIS — Z01419 Encounter for gynecological examination (general) (routine) without abnormal findings: Secondary | ICD-10-CM | POA: Diagnosis not present

## 2023-10-30 ENCOUNTER — Telehealth: Payer: 59 | Admitting: Emergency Medicine

## 2023-10-30 ENCOUNTER — Other Ambulatory Visit (HOSPITAL_COMMUNITY): Payer: Self-pay

## 2023-10-30 DIAGNOSIS — R300 Dysuria: Secondary | ICD-10-CM | POA: Diagnosis not present

## 2023-10-30 MED ORDER — CEPHALEXIN 500 MG PO CAPS
500.0000 mg | ORAL_CAPSULE | Freq: Two times a day (BID) | ORAL | 0 refills | Status: AC
  Filled 2023-10-30: qty 14, 7d supply, fill #0

## 2023-10-30 NOTE — Progress Notes (Signed)
E-Visit for Urinary Problems  We are sorry that you are not feeling well.  Here is how we plan to help!  Based on what you shared with me it looks like you most likely have a simple urinary tract infection.  A UTI (Urinary Tract Infection) is a bacterial infection of the bladder.  Most cases of urinary tract infections are simple to treat but a key part of your care is to encourage you to drink plenty of fluids and watch your symptoms carefully.  I have prescribed Keflex 500 mg twice a day for 7 days.  Your symptoms should gradually improve. Call us if the burning in your urine worsens, you develop worsening fever, back pain or pelvic pain or if your symptoms do not resolve after completing the antibiotic.  Urinary tract infections can be prevented by drinking plenty of water to keep your body hydrated.  Also be sure when you wipe, wipe from front to back and don't hold it in!  If possible, empty your bladder every 4 hours.  HOME CARE Drink plenty of fluids Compete the full course of the antibiotics even if the symptoms resolve Remember, when you need to go.go. Holding in your urine can increase the likelihood of getting a UTI! GET HELP RIGHT AWAY IF: You cannot urinate You get a high fever Worsening back pain occurs You see blood in your urine You feel sick to your stomach or throw up You feel like you are going to pass out  MAKE SURE YOU  Understand these instructions. Will watch your condition. Will get help right away if you are not doing well or get worse.   Thank you for choosing an e-visit.  Your e-visit answers were reviewed by a board certified advanced clinical practitioner to complete your personal care plan. Depending upon the condition, your plan could have included both over the counter or prescription medications.  Please review your pharmacy choice. Make sure the pharmacy is open so you can pick up prescription now. If there is a problem, you may contact your  provider through MyChart messaging and have the prescription routed to another pharmacy.  Your safety is important to us. If you have drug allergies check your prescription carefully.   For the next 24 hours you can use MyChart to ask questions about today's visit, request a non-urgent call back, or ask for a work or school excuse. You will get an email in the next two days asking about your experience. I hope that your e-visit has been valuable and will speed your recovery.  Approximately 5 minutes was used in reviewing the patient's chart, questionnaire, prescribing medications, and documentation.  

## 2023-11-18 ENCOUNTER — Encounter: Payer: Self-pay | Admitting: Family Medicine

## 2023-11-18 ENCOUNTER — Ambulatory Visit (INDEPENDENT_AMBULATORY_CARE_PROVIDER_SITE_OTHER): Payer: 59 | Admitting: Family Medicine

## 2023-11-18 ENCOUNTER — Telehealth: Payer: Self-pay

## 2023-11-18 VITALS — BP 100/64 | HR 60 | Temp 97.8°F | Wt 143.0 lb

## 2023-11-18 DIAGNOSIS — E559 Vitamin D deficiency, unspecified: Secondary | ICD-10-CM | POA: Diagnosis not present

## 2023-11-18 DIAGNOSIS — R200 Anesthesia of skin: Secondary | ICD-10-CM | POA: Diagnosis not present

## 2023-11-18 DIAGNOSIS — Z0000 Encounter for general adult medical examination without abnormal findings: Secondary | ICD-10-CM

## 2023-11-18 LAB — CBC WITH DIFFERENTIAL/PLATELET
Basophils Absolute: 0 10*3/uL (ref 0.0–0.1)
Basophils Relative: 0.5 % (ref 0.0–3.0)
Eosinophils Absolute: 0.1 10*3/uL (ref 0.0–0.7)
Eosinophils Relative: 2 % (ref 0.0–5.0)
HCT: 45.6 % (ref 36.0–46.0)
Hemoglobin: 15 g/dL (ref 12.0–15.0)
Lymphocytes Relative: 40.2 % (ref 12.0–46.0)
Lymphs Abs: 1.8 10*3/uL (ref 0.7–4.0)
MCHC: 32.9 g/dL (ref 30.0–36.0)
MCV: 92.6 fL (ref 78.0–100.0)
Monocytes Absolute: 0.6 10*3/uL (ref 0.1–1.0)
Monocytes Relative: 13.1 % — ABNORMAL HIGH (ref 3.0–12.0)
Neutro Abs: 2 10*3/uL (ref 1.4–7.7)
Neutrophils Relative %: 44.2 % (ref 43.0–77.0)
Platelets: 266 10*3/uL (ref 150.0–400.0)
RBC: 4.93 Mil/uL (ref 3.87–5.11)
RDW: 12.7 % (ref 11.5–15.5)
WBC: 4.5 10*3/uL (ref 4.0–10.5)

## 2023-11-18 LAB — LIPID PANEL
Cholesterol: 190 mg/dL (ref 0–200)
HDL: 76 mg/dL (ref >39.00)
LDL Cholesterol: 105 mg/dL — ABNORMAL HIGH (ref 0–99)
NonHDL: 113.56
Total CHOL/HDL Ratio: 2
Triglycerides: 44 mg/dL (ref 0.0–149.0)
VLDL: 8.8 mg/dL (ref 0.0–40.0)

## 2023-11-18 LAB — B12 AND FOLATE PANEL
Folate: 12.2 ng/mL (ref >5.9)
Vitamin B-12: 226 pg/mL (ref 211–911)

## 2023-11-18 LAB — BASIC METABOLIC PANEL
BUN: 19 mg/dL (ref 6–23)
CO2: 27 meq/L (ref 19–32)
Calcium: 9.6 mg/dL (ref 8.4–10.5)
Chloride: 105 meq/L (ref 96–112)
Creatinine, Ser: 0.9 mg/dL (ref 0.40–1.20)
GFR: 81.32 mL/min (ref >60.00)
Glucose, Bld: 84 mg/dL (ref 70–99)
Potassium: 4.9 meq/L (ref 3.5–5.1)
Sodium: 141 meq/L (ref 135–145)

## 2023-11-18 LAB — TSH: TSH: 2.46 u[IU]/mL (ref 0.35–5.50)

## 2023-11-18 LAB — HEPATIC FUNCTION PANEL
ALT: 18 U/L (ref 0–35)
AST: 23 U/L (ref 0–37)
Albumin: 4.8 g/dL (ref 3.5–5.2)
Alkaline Phosphatase: 41 U/L (ref 39–117)
Bilirubin, Direct: 0.1 mg/dL (ref 0.0–0.3)
Total Bilirubin: 0.4 mg/dL (ref 0.2–1.2)
Total Protein: 7 g/dL (ref 6.0–8.3)

## 2023-11-18 LAB — VITAMIN D 25 HYDROXY (VIT D DEFICIENCY, FRACTURES): VITD: 45.75 ng/mL (ref 30.00–100.00)

## 2023-11-18 NOTE — Progress Notes (Signed)
   Subjective:    Patient ID: Lindsay Case, female    DOB: January 30, 1986, 38 y.o.   MRN: [id number]  HPI CPE- UTD on flu, pap, Tdap  Patient Care Team    Relationship Specialty Notifications Start End  Jess Morita, MD PCP - General Family Medicine  08/12/17   Lucendia Rusk, MD PCP - Cardiology Cardiology  03/19/23   Reggy Capers, MD Consulting Physician Obstetrics and Gynecology  08/12/17   [place], Amy, MD Consulting Physician Dermatology  08/12/17     Health Maintenance  Topic Date Due   HIV Screening  Never done   Hepatitis C Screening  Never done   INFLUENZA VACCINE  06/04/2023   Cervical Cancer Screening (HPV/Pap Cotest)  09/10/2025   DTaP/Tdap/Td (2 - Td or Tdap) 01/11/2027   HPV VACCINES  Aged Out   COVID-19 Vaccine  Discontinued      Review of Systems Patient reports no vision/ hearing changes, adenopathy,fever, weight change,  persistant/recurrent hoarseness , swallowing issues, chest pain, palpitations, edema, persistant/recurrent cough, hemoptysis, dyspnea (rest/exertional/paroxysmal nocturnal), gastrointestinal bleeding (melena, rectal bleeding), abdominal pain, significant heartburn, bowel changes, GU symptoms (dysuria, hematuria, incontinence), Gyn symptoms (abnormal  bleeding, pain),  syncope, focal weakness, memory loss, skin/hair/nail changes, abnormal bruising or bleeding, anxiety, or depression.   + bilateral arm numbness 'from elbows down'    Objective:   Physical Exam General Appearance:    Alert, cooperative, no distress, appears stated age  Head:    Normocephalic, without obvious abnormality, atraumatic  Eyes:    PERRL, conjunctiva/corneas clear, EOM's intact both eyes  Ears:    Normal TM's and external ear canals, both ears  Nose:   Nares normal, septum midline, mucosa normal, no drainage    or sinus tenderness  Throat:   Lips, mucosa, and tongue normal; teeth and gums normal  Neck:   Supple, symmetrical, trachea midline, no adenopathy;     Thyroid : no enlargement/tenderness/nodules  Back:     Symmetric, no curvature, ROM normal, no CVA tenderness  Lungs:     Clear to auscultation bilaterally, respirations unlabored  Chest Wall:    No tenderness or deformity   Heart:    Regular rate and rhythm, S1 and S2 normal, no murmur, rub   or gallop  Breast Exam:    Deferred to GYN  Abdomen:     Soft, non-tender, bowel sounds active all four quadrants,    no masses, no organomegaly  Genitalia:    Deferred to GYN  Rectal:    Extremities:   Extremities normal, atraumatic, no cyanosis or edema  Pulses:   2+ and symmetric all extremities  Skin:   Skin color, texture, turgor normal, no rashes or lesions  Lymph nodes:   Cervical, supraclavicular, and axillary nodes normal  Neurologic:   CNII-XII intact, normal strength, sensation and reflexes    throughout          Assessment & Plan:

## 2023-11-18 NOTE — Telephone Encounter (Signed)
-----   Message from Laymon Priest sent at 11/18/2023  3:57 PM EST ----- Labs look great!  No changes at this time

## 2023-11-18 NOTE — Patient Instructions (Signed)
Follow up in 1 year or as needed We'll notify you of your lab results and make any changes if needed Keep up the good work on healthy diet and regular exercise- you look great! Call with any questions or concerns Stay Safe!  Stay Healthy! ENJOY YOUR TRIP!!!

## 2023-11-18 NOTE — Assessment & Plan Note (Signed)
Pt's PE WNL.  UTD on Tdap, flu, pap.  Check labs.  Anticipatory guidance provided.

## 2023-11-19 NOTE — Telephone Encounter (Signed)
Pt has reviewed labs via MyChart

## 2024-01-19 ENCOUNTER — Other Ambulatory Visit (HOSPITAL_COMMUNITY): Payer: Self-pay

## 2024-01-19 ENCOUNTER — Telehealth: Admitting: Physician Assistant

## 2024-01-19 DIAGNOSIS — J069 Acute upper respiratory infection, unspecified: Secondary | ICD-10-CM

## 2024-01-19 MED ORDER — BENZONATATE 100 MG PO CAPS
100.0000 mg | ORAL_CAPSULE | Freq: Three times a day (TID) | ORAL | 0 refills | Status: DC | PRN
  Filled 2024-01-19: qty 30, 10d supply, fill #0

## 2024-01-19 NOTE — Progress Notes (Signed)

## 2024-01-19 NOTE — Progress Notes (Signed)
 I have spent 5 minutes in review of e-visit questionnaire, review and updating patient chart, medical decision making and response to patient.   Piedad Climes, PA-C

## 2024-03-01 DIAGNOSIS — D2272 Melanocytic nevi of left lower limb, including hip: Secondary | ICD-10-CM | POA: Diagnosis not present

## 2024-03-01 DIAGNOSIS — L910 Hypertrophic scar: Secondary | ICD-10-CM | POA: Diagnosis not present

## 2024-03-01 DIAGNOSIS — D1801 Hemangioma of skin and subcutaneous tissue: Secondary | ICD-10-CM | POA: Diagnosis not present

## 2024-03-01 DIAGNOSIS — D2221 Melanocytic nevi of right ear and external auricular canal: Secondary | ICD-10-CM | POA: Diagnosis not present

## 2024-03-01 DIAGNOSIS — D225 Melanocytic nevi of trunk: Secondary | ICD-10-CM | POA: Diagnosis not present

## 2024-03-01 DIAGNOSIS — D2261 Melanocytic nevi of right upper limb, including shoulder: Secondary | ICD-10-CM | POA: Diagnosis not present

## 2024-03-01 DIAGNOSIS — D2271 Melanocytic nevi of right lower limb, including hip: Secondary | ICD-10-CM | POA: Diagnosis not present

## 2024-03-01 DIAGNOSIS — D2262 Melanocytic nevi of left upper limb, including shoulder: Secondary | ICD-10-CM | POA: Diagnosis not present

## 2024-03-01 DIAGNOSIS — L281 Prurigo nodularis: Secondary | ICD-10-CM | POA: Diagnosis not present

## 2024-07-25 ENCOUNTER — Encounter: Payer: Self-pay | Admitting: Family Medicine

## 2024-07-25 ENCOUNTER — Other Ambulatory Visit (HOSPITAL_COMMUNITY): Payer: Self-pay

## 2024-07-25 ENCOUNTER — Ambulatory Visit: Admitting: Family Medicine

## 2024-07-25 VITALS — BP 118/64 | HR 65 | Temp 98.2°F | Resp 14 | Ht 64.0 in | Wt 140.6 lb

## 2024-07-25 DIAGNOSIS — F419 Anxiety disorder, unspecified: Secondary | ICD-10-CM | POA: Diagnosis not present

## 2024-07-25 DIAGNOSIS — F32A Depression, unspecified: Secondary | ICD-10-CM | POA: Diagnosis not present

## 2024-07-25 DIAGNOSIS — R1033 Periumbilical pain: Secondary | ICD-10-CM | POA: Diagnosis not present

## 2024-07-25 MED ORDER — FLUOXETINE HCL 20 MG PO CAPS
20.0000 mg | ORAL_CAPSULE | Freq: Every day | ORAL | 3 refills | Status: DC
  Filled 2024-07-25: qty 30, 30d supply, fill #0

## 2024-07-25 NOTE — Assessment & Plan Note (Signed)
 Deteriorated.  Pt went off Wellbutrin  last fall as she did not find it effective.  Reports that with the current state of things, she wants to intervene before things get bleak.  States that she felt the best on Prozac .  Will restart at 20mg  daily and monitor for improvement.  Pt expressed understanding and is in agreement w/ plan.

## 2024-07-25 NOTE — Patient Instructions (Signed)
 Follow up in 4 weeks to recheck mood START the Fluoxetine /Prozac  daily We'll call you to schedule your ultrasound This seems like a possible small hernia Call with any questions or concerns Stay Safe!  Stay Healthy! Hang in there!!

## 2024-07-25 NOTE — Progress Notes (Signed)
   Subjective:    Patient ID: Lindsay Case, female    DOB: Apr 08, 1986, 38 y.o.   MRN: [id number]  HPI Abd pain- 'mild but noticeable'.  Episodic.  Not necessarily related to menstruation.  Located to R of umbilicus.  TTP.  Always the same location.  No associated N/V.  No bowel changes.  Anxiety/depression- pt reports she is interested in restarting medication.  Stopped Wellbutrin  last fall.  Reports that Prozac  improved mood but not sure why she stopped.     Review of Systems For ROS see HPI     Objective:   Physical Exam Vitals reviewed.  Constitutional:      General: She is not in acute distress.    Appearance: She is well-developed. She is not ill-appearing.  HENT:     Head: Normocephalic and atraumatic.  Cardiovascular:     Rate and Rhythm: Normal rate.  Pulmonary:     Effort: Pulmonary effort is normal. No respiratory distress.     Breath sounds: No wheezing.  Abdominal:     General: There is no distension.     Palpations: Abdomen is soft. There is no hepatomegaly, splenomegaly or mass.     Tenderness: There is abdominal tenderness (mild TTP just R of umbilicus w/ questionable muscle defect). There is no guarding or rebound. Negative signs include Murphy's sign and McBurney's sign.  Skin:    General: Skin is warm and dry.     Findings: No rash.  Neurological:     General: No focal deficit present.     Mental Status: She is alert and oriented to person, place, and time.  Psychiatric:        Mood and Affect: Mood normal.        Behavior: Behavior normal.           Assessment & Plan:  Abd pain- new.  Just R of umbilicus and always in the same place.  No N/V/D.  Not related to food or menstruation.  Pain is episodic and seemingly w/o pattern.  On PE there is a possible small defect in abd wall.  Will get US  to assess for possible hernia.  Pt expressed understanding and is in agreement w/ plan.

## 2024-07-29 ENCOUNTER — Other Ambulatory Visit: Payer: Self-pay | Admitting: Family Medicine

## 2024-07-29 ENCOUNTER — Other Ambulatory Visit (HOSPITAL_COMMUNITY): Payer: Self-pay

## 2024-07-29 ENCOUNTER — Encounter: Payer: Self-pay | Admitting: Family Medicine

## 2024-07-29 MED ORDER — FLUOXETINE HCL 10 MG PO CAPS
10.0000 mg | ORAL_CAPSULE | Freq: Every day | ORAL | 3 refills | Status: DC
  Filled 2024-07-29: qty 30, 30d supply, fill #0
  Filled 2024-08-23: qty 30, 30d supply, fill #1
  Filled 2024-09-25 – 2024-09-27 (×3): qty 30, 30d supply, fill #2

## 2024-07-29 NOTE — Progress Notes (Signed)
 Lower dose sent in for pt due to side effects

## 2024-08-01 ENCOUNTER — Ambulatory Visit
Admission: RE | Admit: 2024-08-01 | Discharge: 2024-08-01 | Disposition: A | Discharge status: Home / Self Care | Source: Ambulatory Visit | Attending: Family Medicine | Admitting: Family Medicine

## 2024-08-01 DIAGNOSIS — R1033 Periumbilical pain: Secondary | ICD-10-CM

## 2024-08-01 DIAGNOSIS — R109 Unspecified abdominal pain: Secondary | ICD-10-CM | POA: Diagnosis not present

## 2024-08-02 ENCOUNTER — Ambulatory Visit: Payer: Self-pay | Admitting: Family Medicine

## 2024-08-02 NOTE — Telephone Encounter (Signed)
Patient called back and was informed of the results

## 2024-08-02 NOTE — Progress Notes (Signed)
 Called patient to relay results. Left vm to return call

## 2024-08-03 DIAGNOSIS — H5203 Hypermetropia, bilateral: Secondary | ICD-10-CM | POA: Diagnosis not present

## 2024-08-24 ENCOUNTER — Telehealth: Admitting: Family Medicine

## 2024-08-24 ENCOUNTER — Ambulatory Visit: Admitting: Family Medicine

## 2024-08-24 ENCOUNTER — Other Ambulatory Visit (HOSPITAL_COMMUNITY): Payer: Self-pay

## 2024-08-24 ENCOUNTER — Encounter: Payer: Self-pay | Admitting: Family Medicine

## 2024-08-24 DIAGNOSIS — F32A Depression, unspecified: Secondary | ICD-10-CM | POA: Diagnosis not present

## 2024-08-24 DIAGNOSIS — F419 Anxiety disorder, unspecified: Secondary | ICD-10-CM

## 2024-08-24 NOTE — Progress Notes (Signed)
   Virtual Visit via Video   I connected with patient on 08/24/24 at  2:00 PM EDT by a video enabled telemedicine application and verified that I am speaking with the correct person using two identifiers.  Location patient: Home Location provider: [facility], Office Persons participating in the virtual visit: Patient, Provider, CMA (Tonya H)  I discussed the limitations of evaluation and management by telemedicine and the availability of in person appointments. The patient expressed understanding and agreed to proceed.  Subjective:   HPI:   Anxiety/Depression- at last visit restarted Fluoxetine  20mg  daily.  Found that she cared 'too little' when on the 20mg  dose.  Also had a hard time sleeping.  Since decreasing to 10mg  dose things seem to be better.  Pt finds that she is back to sleeping since lowering the dose.  Lost 10 lbs in the last 2 months but primarily the 1st 2 weeks due to decreased appetite.  Has somewhat returned to normal eating w/ lower dose- not feeling shaky or dizzy.    ROS:   See pertinent positives and negatives per HPI.  Patient Active Problem List   Diagnosis Date Noted   Vitamin D  deficiency 11/12/2022   Overweight (BMI 25.0-29.9) 11/07/2020   Anxiety and depression 04/25/2019   Physical exam 08/12/2017   Cervical intraepithelial neoplasia grade 2 11/03/2012    Social History   Tobacco Use   Smoking status: Never   Smokeless tobacco: Never  Substance Use Topics   Alcohol use: Yes    Alcohol/week: 3.0 standard drinks of alcohol    Types: 3 Standard drinks or equivalent per week    Current Outpatient Medications:    Cholecalciferol (VITAMIN D ) 50 MCG (2000 UT) CAPS, Take by mouth., Disp: , Rfl:    FLUoxetine  (PROZAC ) 10 MG capsule, Take 1 capsule (10 mg total) by mouth daily., Disp: 30 capsule, Rfl: 3  No Known Allergies  Objective:   There were no vitals taken for this visit. AAOx3, NAD NCAT, EOMI No obvious CN deficits Coloring  WNL Pt is able to speak clearly, coherently without shortness of breath or increased work of breathing.  Thought process is linear.  Mood is appropriate.  Assessment and Plan:   Anxiety/Depression- much improved on Fluoxetine  10mg  daily.  20mg  dose caused side effects but she feels things are going well on 10mg  daily.  No changes at this time but she knows she can message me in the future if we need to adjust her dose.   Comer Greet, MD 08/24/2024

## 2024-09-26 ENCOUNTER — Encounter: Payer: Self-pay | Admitting: Pharmacist

## 2024-09-26 ENCOUNTER — Other Ambulatory Visit: Payer: Self-pay

## 2024-09-27 ENCOUNTER — Other Ambulatory Visit (HOSPITAL_COMMUNITY): Payer: Self-pay

## 2024-09-27 ENCOUNTER — Encounter (HOSPITAL_COMMUNITY): Payer: Self-pay

## 2024-09-28 ENCOUNTER — Other Ambulatory Visit (HOSPITAL_COMMUNITY): Payer: Self-pay

## 2024-09-28 ENCOUNTER — Other Ambulatory Visit: Payer: Self-pay

## 2024-09-30 ENCOUNTER — Other Ambulatory Visit (HOSPITAL_COMMUNITY): Payer: Self-pay

## 2024-10-05 DIAGNOSIS — Z01419 Encounter for gynecological examination (general) (routine) without abnormal findings: Secondary | ICD-10-CM | POA: Diagnosis not present

## 2024-10-05 DIAGNOSIS — N6452 Nipple discharge: Secondary | ICD-10-CM | POA: Diagnosis not present

## 2024-10-05 DIAGNOSIS — N911 Secondary amenorrhea: Secondary | ICD-10-CM | POA: Diagnosis not present

## 2024-10-07 ENCOUNTER — Other Ambulatory Visit (HOSPITAL_COMMUNITY): Payer: Self-pay

## 2024-10-07 MED ORDER — MEDROXYPROGESTERONE ACETATE 10 MG PO TABS
10.0000 mg | ORAL_TABLET | Freq: Every day | ORAL | 2 refills | Status: DC
  Filled 2024-10-07: qty 10, 10d supply, fill #0

## 2024-10-10 ENCOUNTER — Other Ambulatory Visit: Payer: Self-pay | Admitting: Obstetrics and Gynecology

## 2024-10-10 DIAGNOSIS — N6452 Nipple discharge: Secondary | ICD-10-CM

## 2024-10-12 ENCOUNTER — Encounter: Payer: Self-pay | Admitting: Family Medicine

## 2024-10-12 NOTE — Telephone Encounter (Signed)
 Pt is requesting to go up to 20 mg Fluoxetine  discussed last OV 07/25/24, please advise

## 2024-10-13 ENCOUNTER — Other Ambulatory Visit (HOSPITAL_COMMUNITY): Payer: Self-pay

## 2024-10-13 MED ORDER — FLUOXETINE HCL 20 MG PO CAPS
20.0000 mg | ORAL_CAPSULE | Freq: Every day | ORAL | 3 refills | Status: AC
  Filled 2024-10-13: qty 30, 30d supply, fill #0
  Filled 2024-11-22: qty 30, 30d supply, fill #1

## 2024-11-07 ENCOUNTER — Telehealth: Payer: Self-pay

## 2024-11-07 NOTE — Telephone Encounter (Signed)
 Called patient and she verbalized understanding

## 2024-11-07 NOTE — Telephone Encounter (Signed)
 Patient is wondering if she can complete her med review the same time as her physical?

## 2024-11-07 NOTE — Telephone Encounter (Signed)
 Copied from CRM [id number]. Topic: General - Other >> Nov 07, 2024  1:25 PM Victoria A wrote: Reason for CRM: Patient would like to know if she can have her med review/management done during her physical. Please contact via mychart or phone Mobile [phone]

## 2024-11-07 NOTE — Telephone Encounter (Signed)
 Yes, we can do both at the same time.

## 2024-11-11 ENCOUNTER — Ambulatory Visit
Admission: RE | Admit: 2024-11-11 | Discharge: 2024-11-11 | Disposition: A | Source: Ambulatory Visit | Attending: Obstetrics and Gynecology | Admitting: Obstetrics and Gynecology

## 2024-11-11 DIAGNOSIS — N6452 Nipple discharge: Secondary | ICD-10-CM

## 2024-11-21 ENCOUNTER — Ambulatory Visit: Admitting: Family Medicine

## 2024-11-21 ENCOUNTER — Encounter: Payer: Self-pay | Admitting: Family Medicine

## 2024-11-21 ENCOUNTER — Encounter: Payer: 59 | Admitting: Family Medicine

## 2024-11-21 VITALS — BP 118/82 | HR 61 | Ht 64.0 in | Wt 138.2 lb

## 2024-11-21 DIAGNOSIS — Z0000 Encounter for general adult medical examination without abnormal findings: Secondary | ICD-10-CM | POA: Diagnosis not present

## 2024-11-21 DIAGNOSIS — Z1159 Encounter for screening for other viral diseases: Secondary | ICD-10-CM

## 2024-11-21 DIAGNOSIS — E559 Vitamin D deficiency, unspecified: Secondary | ICD-10-CM | POA: Diagnosis not present

## 2024-11-21 DIAGNOSIS — E7800 Pure hypercholesterolemia, unspecified: Secondary | ICD-10-CM

## 2024-11-21 DIAGNOSIS — Z114 Encounter for screening for human immunodeficiency virus [HIV]: Secondary | ICD-10-CM | POA: Diagnosis not present

## 2024-11-21 LAB — HEPATIC FUNCTION PANEL
ALT: 13 U/L (ref 3–35)
AST: 17 U/L (ref 5–37)
Albumin: 4.8 g/dL (ref 3.5–5.2)
Alkaline Phosphatase: 41 U/L (ref 39–117)
Bilirubin, Direct: 0.1 mg/dL (ref 0.1–0.3)
Total Bilirubin: 0.4 mg/dL (ref 0.2–1.2)
Total Protein: 6.7 g/dL (ref 6.0–8.3)

## 2024-11-21 LAB — BASIC METABOLIC PANEL WITH GFR
BUN: 17 mg/dL (ref 6–23)
CO2: 28 meq/L (ref 19–32)
Calcium: 9.7 mg/dL (ref 8.4–10.5)
Chloride: 104 meq/L (ref 96–112)
Creatinine, Ser: 0.76 mg/dL (ref 0.40–1.20)
GFR: 98.91 mL/min
Glucose, Bld: 86 mg/dL (ref 70–99)
Potassium: 4.5 meq/L (ref 3.5–5.1)
Sodium: 138 meq/L (ref 135–145)

## 2024-11-21 LAB — LIPID PANEL
Cholesterol: 177 mg/dL (ref 28–200)
HDL: 78.9 mg/dL
LDL Cholesterol: 85 mg/dL (ref 10–99)
NonHDL: 97.8
Total CHOL/HDL Ratio: 2
Triglycerides: 62 mg/dL (ref 10.0–149.0)
VLDL: 12.4 mg/dL (ref 0.0–40.0)

## 2024-11-21 LAB — CBC WITH DIFFERENTIAL/PLATELET
Basophils Absolute: 0 K/uL (ref 0.0–0.1)
Basophils Relative: 0.4 % (ref 0.0–3.0)
Eosinophils Absolute: 0.1 K/uL (ref 0.0–0.7)
Eosinophils Relative: 1.5 % (ref 0.0–5.0)
HCT: 41.3 % (ref 36.0–46.0)
Hemoglobin: 13.8 g/dL (ref 12.0–15.0)
Lymphocytes Relative: 31.8 % (ref 12.0–46.0)
Lymphs Abs: 1.5 K/uL (ref 0.7–4.0)
MCHC: 33.3 g/dL (ref 30.0–36.0)
MCV: 90.4 fl (ref 78.0–100.0)
Monocytes Absolute: 0.5 K/uL (ref 0.1–1.0)
Monocytes Relative: 10.5 % (ref 3.0–12.0)
Neutro Abs: 2.6 K/uL (ref 1.4–7.7)
Neutrophils Relative %: 55.8 % (ref 43.0–77.0)
Platelets: 234 K/uL (ref 150.0–400.0)
RBC: 4.57 Mil/uL (ref 3.87–5.11)
RDW: 13 % (ref 11.5–15.5)
WBC: 4.7 K/uL (ref 4.0–10.5)

## 2024-11-21 LAB — TSH: TSH: 1.46 u[IU]/mL (ref 0.35–5.50)

## 2024-11-21 LAB — VITAMIN D 25 HYDROXY (VIT D DEFICIENCY, FRACTURES): VITD: 40.22 ng/mL (ref 30.00–100.00)

## 2024-11-21 NOTE — Assessment & Plan Note (Signed)
Pt's PE WNL.  UTD on pap, Tdap, flu.  Check labs.  Anticipatory guidance provided.  

## 2024-11-21 NOTE — Patient Instructions (Addendum)
 Follow up in 1 year or as needed We'll notify you of your lab results and make any changes if needed Keep up the good work on healthy diet and regular exercise- you look great! Call with any questions or concerns Stay Safe!  Stay Healthy! Happy New Year!!

## 2024-11-21 NOTE — Progress Notes (Signed)
" ° °  Subjective:    Patient ID: Lindsay Case, female    DOB: Nov 28, 1985, 39 y.o.   MRN: [id number]  HPI CPE-  UTD on pap, Tdap, flu.  Health Maintenance  Topic Date Due   HIV Screening  Never done   Hepatitis C Screening  Never done   Hepatitis B Vaccines 19-59 Average Risk (1 of 3 - 19+ 3-dose series) Never done   Influenza Vaccine  06/03/2024   Cervical Cancer Screening (HPV/Pap Cotest)  09/10/2025   DTaP/Tdap/Td (2 - Td or Tdap) 01/11/2027   HPV VACCINES (No Doses Required) Completed   Pneumococcal Vaccine  Aged Out   Meningococcal B Vaccine  Aged Out   COVID-19 Vaccine  Discontinued     Patient Care Team    Relationship Specialty Notifications Start End  Mahlon Comer BRAVO, MD PCP - General Family Medicine  08/12/17   Dann Candyce GORMAN, MD PCP - Cardiology Cardiology  03/19/23   Okey Leader, MD Consulting Physician Obstetrics and Gynecology  08/12/17   Jordan, Amy, MD Consulting Physician Dermatology  08/12/17       Review of Systems Patient reports no vision/ hearing changes, adenopathy,fever, weight change,  persistant/recurrent hoarseness , swallowing issues, chest pain, palpitations, edema, persistant/recurrent cough, hemoptysis, dyspnea (rest/exertional/paroxysmal nocturnal), gastrointestinal bleeding (melena, rectal bleeding), abdominal pain, significant heartburn, bowel changes, GU symptoms (dysuria, hematuria, incontinence), Gyn symptoms (abnormal  bleeding, pain),  syncope, focal weakness, memory loss, numbness & tingling, skin/hair/nail changes, abnormal bruising or bleeding, anxiety, or depression.     Objective:   Physical Exam General Appearance:    Alert, cooperative, no distress, appears stated age  Head:    Normocephalic, without obvious abnormality, atraumatic  Eyes:    PERRL, conjunctiva/corneas clear, EOM's intact both eyes  Ears:    Normal TM's and external ear canals, both ears  Nose:   Nares normal, septum midline, mucosa normal, no drainage    or  sinus tenderness  Throat:   Lips, mucosa, and tongue normal; teeth and gums normal  Neck:   Supple, symmetrical, trachea midline, no adenopathy;    Thyroid : no enlargement/tenderness/nodules  Back:     Symmetric, no curvature, ROM normal, no CVA tenderness  Lungs:     Clear to auscultation bilaterally, respirations unlabored  Chest Wall:    No tenderness or deformity   Heart:    Regular rate and rhythm, S1 and S2 normal, no murmur, rub   or gallop  Breast Exam:    Deferred to GYN  Abdomen:     Soft, non-tender, bowel sounds active all four quadrants,    no masses, no organomegaly  Genitalia:    Deferred to GYN  Rectal:    Extremities:   Extremities normal, atraumatic, no cyanosis or edema  Pulses:   2+ and symmetric all extremities  Skin:   Skin color, texture, turgor normal, no rashes or lesions  Lymph nodes:   Cervical, supraclavicular, and axillary nodes normal  Neurologic:   CNII-XII intact, normal strength, sensation and reflexes    throughout          Assessment & Plan:    "

## 2024-11-22 ENCOUNTER — Ambulatory Visit: Payer: Self-pay | Admitting: Family Medicine

## 2024-11-22 LAB — HIV ANTIBODY (ROUTINE TESTING W REFLEX)
HIV 1&2 Ab, 4th Generation: NONREACTIVE
HIV FINAL INTERPRETATION: NEGATIVE

## 2024-11-22 LAB — HEPATITIS C ANTIBODY: Hepatitis C Ab: NONREACTIVE

## 2024-11-23 ENCOUNTER — Other Ambulatory Visit (HOSPITAL_COMMUNITY): Payer: Self-pay

## 2025-01-20 ENCOUNTER — Encounter: Admitting: Family Medicine

## 2025-11-22 ENCOUNTER — Encounter: Admitting: Family Medicine
